# Patient Record
Sex: Male | Born: 1970
Health system: Southern US, Community
[De-identification: ages and names within clinical notes are randomized; demographics above are authoritative.]

## PROBLEM LIST (undated history)

## (undated) DIAGNOSIS — L109 Pemphigus, unspecified: Secondary | ICD-10-CM

## (undated) DIAGNOSIS — J189 Pneumonia, unspecified organism: Secondary | ICD-10-CM

## (undated) HISTORY — DX: Pemphigus, unspecified: L10.9

---

## 2008-09-13 ENCOUNTER — Observation Stay (HOSPITAL_COMMUNITY): Admission: EM | Admit: 2008-09-13 | Discharge: 2008-09-14 | Payer: Self-pay | Admitting: Emergency Medicine

## 2011-07-14 ENCOUNTER — Emergency Department (HOSPITAL_BASED_OUTPATIENT_CLINIC_OR_DEPARTMENT_OTHER)
Admission: EM | Admit: 2011-07-14 | Discharge: 2011-07-14 | Disposition: A | Discharge status: Home Health | Payer: Worker's Compensation | Attending: Emergency Medicine | Admitting: Emergency Medicine

## 2011-07-14 ENCOUNTER — Encounter (HOSPITAL_BASED_OUTPATIENT_CLINIC_OR_DEPARTMENT_OTHER): Payer: Self-pay

## 2011-07-14 DIAGNOSIS — S0101XA Laceration without foreign body of scalp, initial encounter: Secondary | ICD-10-CM

## 2011-07-14 DIAGNOSIS — W268XXA Contact with other sharp object(s), not elsewhere classified, initial encounter: Secondary | ICD-10-CM | POA: Insufficient documentation

## 2011-07-14 DIAGNOSIS — S0180XA Unspecified open wound of other part of head, initial encounter: Secondary | ICD-10-CM | POA: Insufficient documentation

## 2011-07-14 DIAGNOSIS — F17200 Nicotine dependence, unspecified, uncomplicated: Secondary | ICD-10-CM | POA: Insufficient documentation

## 2011-07-14 NOTE — ED Notes (Signed)
Pt c/o laceration on R head.  Bleeding controlled upon arrival.  Pt unaware of Tetanus status.  No LOC.

## 2011-07-14 NOTE — ED Provider Notes (Signed)
Medical screening examination/treatment/procedure(s) were performed by non-physician practitioner and as supervising physician I was immediately available for consultation/collaboration.   Carleene Cooper III, MD 07/14/11 [phone]

## 2011-07-14 NOTE — ED Provider Notes (Signed)
History     CSN: [id number]  Arrival date & time 07/14/11  1159   First MD Initiated Contact with Patient 07/14/11 1241      Chief Complaint  Patient presents with  . Laceration    (Consider location/radiation/quality/duration/timing/severity/associated sxs/prior treatment) Patient is a 41 y.o. male presenting with skin laceration. The history is provided by the patient.  Laceration  The incident occurred less than 1 hour ago. The laceration is located on the scalp. The laceration is 1 cm in size. The laceration mechanism is unknown.The pain is at a severity of 2/10. The pain is mild. Possible foreign bodies include metal.  Pt   History reviewed. No pertinent past medical history.  History reviewed. No pertinent past surgical history.  No family history on file.  History  Substance Use Topics  . Smoking status: Current Everyday Smoker  . Smokeless tobacco: Not on file  . Alcohol Use: No      Review of Systems  Skin: Positive for wound.  All other systems reviewed and are negative.    Allergies  Review of patient's allergies indicates no known allergies.  Home Medications  No current outpatient prescriptions on file.  BP 118/72  Pulse 70  Temp 98.2 F (36.8 C) (Oral)  Resp 16  Ht 5\' 3"  (1.6 m)  Wt 120 lb (54.432 kg)  BMI 21.26 kg/m2  SpO2 99%  Physical Exam  Nursing note and vitals reviewed. Constitutional: He is oriented to person, place, and time. He appears well-developed and well-nourished.  HENT:  Head: Normocephalic.       1cm laceration forehead  Eyes: Conjunctivae and EOM are normal. Pupils are equal, round, and reactive to light.  Neck: Normal range of motion.  Cardiovascular: Normal rate.   Pulmonary/Chest: Effort normal.  Musculoskeletal: Normal range of motion.  Neurological: He is alert and oriented to person, place, and time. He has normal reflexes.  Skin: Skin is warm and dry.  Psychiatric: He has a normal mood and affect.    ED  Course  LACERATION REPAIR Date/Time: 07/14/2011 1:47 PM Performed by: Elson Areas Authorized by: Elson Areas Consent: Verbal consent not obtained. Consent given by: patient Body area: head/neck Laceration length: 1 cm Skin closure: staples Number of sutures: 1 Approximation difficulty: simple   (including critical care time)  Labs Reviewed - No data to display No results found.   1. Laceration of scalp       MDM  Staple removal in 7 days        Williams Dietrick [place], [place] 07/14/11 1347

## 2011-07-14 NOTE — ED Notes (Signed)
UDS required per Dorothey Baseman, Plant Mgr.

## 2011-07-14 NOTE — Discharge Instructions (Signed)
Laceration Care, Adult °A laceration is a cut that goes through all layers of the skin. The cut goes into the tissue beneath the skin. °HOME CARE °For stitches (sutures) or staples: °· Keep the cut clean and dry.  °· If you have a bandage (dressing), change it at least once a day. Change the bandage if it gets wet or dirty, or as told by your doctor.  °· Wash the cut with soap and water 2 times a day. Rinse the cut with water. Pat it dry with a clean towel.  °· Put a thin layer of medicated cream on the cut as told by your doctor.  °· You may shower after the first 24 hours. Do not soak the cut in water until the stitches are removed.  °· Only take medicines as told by your doctor.  °· Have your stitches or staples removed as told by your doctor.  °For skin adhesive strips: °· Keep the cut clean and dry.  °· Do not get the strips wet. You may take a bath, but be careful to keep the cut dry.  °· If the cut gets wet, pat it dry with a clean towel.  °· The strips will fall off on their own. Do not remove the strips that are still stuck to the cut.  °For wound glue: °· You may shower or take baths. Do not soak or scrub the cut. Do not swim. Avoid heavy sweating until the glue falls off on its own. After a shower or bath, pat the cut dry with a clean towel.  °· Do not put medicine on your cut until the glue falls off.  °· If you have a bandage, do not put tape over the glue.  °· Avoid lots of sunlight or tanning lamps until the glue falls off. Put sunscreen on the cut for the first year to reduce your scar.  °· The glue will fall off on its own. Do not pick at the glue.  °You may need a tetanus shot if: °· You cannot remember when you had your last tetanus shot.  °· You have never had a tetanus shot.  °If you need a tetanus shot and you choose not to have one, you may get tetanus. Sickness from tetanus can be serious. °GET HELP RIGHT AWAY IF:  °· Your pain does not get better with medicine.  °· Your arm, hand, leg, or  foot loses feeling (numbness) or changes color.  °· Your cut is bleeding.  °· Your joint feels weak, or you cannot use your joint.  °· You have painful lumps on your body.  °· Your cut is red, puffy (swollen), or painful.  °· You have a red line on the skin near the cut.  °· You have yellowish-white fluid (pus) coming from the cut.  °· You have a fever.  °· You have a bad smell coming from the cut or bandage.  °· Your cut breaks open before or after stitches are removed.  °· You notice something coming out of the cut, such as wood or glass.  °· You cannot move a finger or toe.  °MAKE SURE YOU:  °· Understand these instructions.  °· Will watch your condition.  °· Will get help right away if you are not doing well or get worse.  °Document Released: 06/24/2007 Document Revised: 12/25/2010 Document Reviewed: 07/01/2010 °ExitCare® Patient Information ©2012 [employer]. °

## 2013-08-16 ENCOUNTER — Other Ambulatory Visit: Payer: Self-pay | Admitting: Family Medicine

## 2013-08-16 DIAGNOSIS — R200 Anesthesia of skin: Secondary | ICD-10-CM

## 2013-08-22 ENCOUNTER — Other Ambulatory Visit: Payer: Self-pay | Admitting: Family Medicine

## 2013-08-22 DIAGNOSIS — Z139 Encounter for screening, unspecified: Secondary | ICD-10-CM

## 2013-08-24 ENCOUNTER — Ambulatory Visit
Admission: RE | Admit: 2013-08-24 | Discharge: 2013-08-24 | Disposition: A | Discharge status: Home / Self Care | Payer: PRIVATE HEALTH INSURANCE | Source: Ambulatory Visit | Attending: Family Medicine | Admitting: Family Medicine

## 2013-08-24 DIAGNOSIS — Z139 Encounter for screening, unspecified: Secondary | ICD-10-CM

## 2013-08-24 DIAGNOSIS — R200 Anesthesia of skin: Secondary | ICD-10-CM

## 2017-10-23 ENCOUNTER — Other Ambulatory Visit: Payer: Self-pay

## 2017-10-23 ENCOUNTER — Ambulatory Visit: Payer: BLUE CROSS/BLUE SHIELD | Admitting: Family Medicine

## 2017-10-23 ENCOUNTER — Encounter: Payer: Self-pay | Admitting: Family Medicine

## 2017-10-23 VITALS — BP 125/75 | HR 64 | Temp 98.0°F | Resp 16 | Wt 142.0 lb

## 2017-10-23 DIAGNOSIS — S1012XA Blister (nonthermal) of throat, initial encounter: Secondary | ICD-10-CM

## 2017-10-23 DIAGNOSIS — J029 Acute pharyngitis, unspecified: Secondary | ICD-10-CM | POA: Diagnosis not present

## 2017-10-23 LAB — POCT RAPID STREP A (OFFICE): Rapid Strep A Screen: NEGATIVE

## 2017-10-23 MED ORDER — VALACYCLOVIR HCL 1 G PO TABS: 1000.0000 mg | ORAL_TABLET | Freq: Two times a day (BID) | ORAL | 0 refills | Status: DC

## 2017-10-23 MED ORDER — LIDOCAINE VISCOUS HCL 2 % MT SOLN: 15.0000 mL | OROMUCOSAL | 0 refills | Status: DC | PRN

## 2017-10-23 NOTE — Patient Instructions (Signed)
° ° ° °  If you have lab work done today you will be contacted with your lab results within the next 2 weeks.  If you have not heard from us then please contact us. The fastest way to get your results is to register for My Chart. ° ° °IF you received an x-ray today, you will receive an invoice from [facility]. Please contact [facility] at [phone] with questions or concerns regarding your invoice.  ° °IF you received labwork today, you will receive an invoice from [facility]. Please contact [facility] at [phone] with questions or concerns regarding your invoice.  ° °Our billing staff will not be able to assist you with questions regarding bills from these companies. ° °You will be contacted with the lab results as soon as they are available. The fastest way to get your results is to activate your My Chart account. Instructions are located on the last page of this paperwork. If you have not heard from us regarding the results in 2 weeks, please contact this office. °  ° ° ° °

## 2017-10-23 NOTE — Progress Notes (Signed)
Patient ID: Kyle Guzman, male    DOB: [birth year], 47 y.o.   MRN: [id number]  PCP: Patient, No Pcp Per  Chief Complaint  Patient presents with  . Sore Throat    x 2 days     Subjective:  HPI Kyle Guzman is a 47 y.o. male presents for evaluation of sore throat x 1 days. He reports pain with swallowing solid foods.  Denies cough, nasal drainage, fever, or dysphasia. History of smoking, however quit several years ago.  Prior diagnosis of strep throat and no history of pharyngitis.Has not taken anything for pain. Social History   Socioeconomic History  . Marital status: Unknown    Spouse name: Not on file  . Number of children: Not on file  . Years of education: Not on file  . Highest education level: Not on file  Occupational History  . Not on file  Social Needs  . Financial resource strain: Not on file  . Food insecurity:    Worry: Not on file    Inability: Not on file  . Transportation needs:    Medical: Not on file    Non-medical: Not on file  Tobacco Use  . Smoking status: Current Every Day Smoker  . Smokeless tobacco: Never Used  Substance and Sexual Activity  . Alcohol use: No  . Drug use: No  . Sexual activity: Not on file  Lifestyle  . Physical activity:    Days per week: Not on file    Minutes per session: Not on file  . Stress: Not on file  Relationships  . Social connections:    Talks on phone: Not on file    Gets together: Not on file    Attends religious service: Not on file    Active member of club or organization: Not on file    Attends meetings of clubs or organizations: Not on file    Relationship status: Not on file  . Intimate partner violence:    Fear of current or ex partner: Not on file    Emotionally abused: Not on file    Physically abused: Not on file    Forced sexual activity: Not on file  Other Topics Concern  . Not on file  Social History Narrative  . Not on file    History reviewed. No pertinent family history.   Review of  Systems Pertinent negatives listed in HPI  There are no active problems to display for this patient.   No Known Allergies  Prior to Admission medications   Not on File    Past Medical, Surgical Family and Social History reviewed and updated.    Objective:   Today's Vitals   10/23/17 1203  BP: 125/75  Pulse: 64  Resp: 16  Temp: 98 F (36.7 C)  TempSrc: Oral  SpO2: 99%  Weight: 142 lb (64.4 kg)    Wt Readings from Last 3 Encounters:  10/23/17 142 lb (64.4 kg)  07/14/11 120 lb (54.4 kg)       Physical Exam General appearance: alert, well developed, well nourished, cooperative and in no distress HENT: Normocephalic. Left upper tonsillar and oropharyngeal wall significant fine clear blisters with surrounding erythema and mild edema noted . Respiratory: Respirations even and unlabored, normal respiratory rate Heart: rate and rhythm normal. No gallop or murmurs noted on exam  Extremities: No gross deformities Skin: Skin color, texture, turgor normal. No rashes seen  Psych: Appropriate mood and affect. Neurologic: Mental status: Alert, oriented to person, place, and  time, thought content appropriate.   Assessment & Plan:  1. Sore throat, on exam left tonsillar and oropharyngeal sniffing cannot for your erythema as well as fine blister like lesions.  Pharyngeal include HSV or shingles.  This likely streptococcal infection however strep culture.  Also obtain a viral culture to rule out HSV.  Will trial a course of Valtrex x10 days decrease duration of virus as well as provide lidocaine rinse to minimize pain.  To follow-up 10/26/2017 further evaluation of throat and to see if symptoms are improving.  No improvement patient may warrant referral to ENT for second opinion.  - POCT rapid strep A - Culture, Group A Strep  Meds ordered this encounter  Medications  . lidocaine (XYLOCAINE) 2 % solution    Sig: Use as directed 15 mLs in the mouth or throat as needed for mouth  pain.    Dispense:  100 mL    Refill:  0  . valACYclovir (VALTREX) 1000 MG tablet    Sig: Take 1 tablet (1,000 mg total) by mouth 2 (two) times daily for 10 days.    Dispense:  20 tablet    Refill:  0      -The patient was given clear instructions to go to ER or return to medical center if symptoms do not improve, worsen or new problems develop. The patient verbalized understanding.     Godfrey Pick. Tiburcio Pea, FNP-C Nurse Practitioner (PRN Staff)  Primary Care at [facility] [address] [place], [place]  [phone]

## 2017-10-25 LAB — HERPES SIMPLEX VIRUS CULTURE

## 2017-10-26 ENCOUNTER — Encounter: Payer: Self-pay | Admitting: Family Medicine

## 2017-10-26 ENCOUNTER — Other Ambulatory Visit: Payer: Self-pay

## 2017-10-26 ENCOUNTER — Ambulatory Visit: Payer: BLUE CROSS/BLUE SHIELD | Admitting: Family Medicine

## 2017-10-26 VITALS — BP 116/75 | HR 75 | Temp 98.3°F | Resp 16 | Ht 63.0 in | Wt 143.8 lb

## 2017-10-26 DIAGNOSIS — S1012XD Blister (nonthermal) of throat, subsequent encounter: Secondary | ICD-10-CM | POA: Diagnosis not present

## 2017-10-26 DIAGNOSIS — J392 Other diseases of pharynx: Secondary | ICD-10-CM

## 2017-10-26 DIAGNOSIS — S1012XA Blister (nonthermal) of throat, initial encounter: Secondary | ICD-10-CM

## 2017-10-26 LAB — CULTURE, GROUP A STREP

## 2017-10-26 NOTE — Progress Notes (Signed)
Chief Complaint  Patient presents with  . Sore Throat    onset: 10/22/17*with blisters, seen and given medication for it used med friday, sat, sun and monday no med today.  Blister back of throat    HPI  Pt with oral lesion in his mouth that is like a blister He states that given lidocaine solution and valtrex  He reports that he had a day of chills the day before the oral lesion started Onset 10/22/2017 He was seen on 10/23/17 and had cultures taken of the throat He states that the lidocaine solution and valtrex are not helping He denies pain with swallowing He reports that the throat feels normal It looks bloody to him like it is spreading from one side to the next He is a nonsmoker   No past medical history on file.  Current Outpatient Medications  Medication Sig Dispense Refill  . lidocaine (XYLOCAINE) 2 % solution Use as directed 15 mLs in the mouth or throat as needed for mouth pain. 100 mL 0  . valACYclovir (VALTREX) 1000 MG tablet Take 1 tablet (1,000 mg total) by mouth 2 (two) times daily for 10 days. 20 tablet 0   No current facility-administered medications for this visit.     Allergies: No Known Allergies  No past surgical history on file.  Social History   Socioeconomic History  . Marital status: Unknown    Spouse name: Not on file  . Number of children: Not on file  . Years of education: Not on file  . Highest education level: Not on file  Occupational History  . Not on file  Social Needs  . Financial resource strain: Not on file  . Food insecurity:    Worry: Not on file    Inability: Not on file  . Transportation needs:    Medical: Not on file    Non-medical: Not on file  Tobacco Use  . Smoking status: Former Games developer  . Smokeless tobacco: Former Neurosurgeon    Quit date: 10/27/2010  Substance and Sexual Activity  . Alcohol use: No  . Drug use: No  . Sexual activity: Not on file  Lifestyle  . Physical activity:    Days per week: Not on file   Minutes per session: Not on file  . Stress: Not on file  Relationships  . Social connections:    Talks on phone: Not on file    Gets together: Not on file    Attends religious service: Not on file    Active member of club or organization: Not on file    Attends meetings of clubs or organizations: Not on file    Relationship status: Not on file  Other Topics Concern  . Not on file  Social History Narrative  . Not on file    No family history on file.   ROS Review of Systems See HPI Constitution: No fevers or chills No malaise No diaphoresis Skin: No rash or itching Eyes: no blurry vision, no double vision GU: no dysuria or hematuria Neuro: no dizziness or headaches all others reviewed and negative   Objective: Vitals:   10/26/17 1450  BP: 116/75  Pulse: 75  Resp: 16  Temp: 98.3 F (36.8 C)  TempSrc: Oral  SpO2: 97%  Weight: 143 lb 12.8 oz (65.2 kg)  Height: 5\' 3"  (1.6 m)    Physical Exam  Constitutional: He appears well-developed and well-nourished.  HENT:  Head: Normocephalic and atraumatic.  Pulmonary/Chest: Effort normal.   Throat  exam Soft palate with hemorrhagic and vesicular lesion Palate raises appropriately Normal exam of the tongue Trachea midline Mucosa moist Non-tender lymph nodes the the neck   Today's image 10/26/2017- area of erythema and hemorrhage cross ing the midline   Image on 10/23/2017 - area of erythema concentrated to the left posterior pharynx    HSV culture  Negative GAS culture  Negative Rapid strep negative  Assessment and Plan [place] was seen today for sore throat.  Diagnoses and all orders for this visit:  Blister of throat -     Ambulatory referral to ENT  Lesion of throat -     Ambulatory referral to ENT  will refer Urgently to ENT Advised him to stop the valtrex since the culture is negative  Since there is no pain he can stop the lidocaine   Lasean Rahming A Creta Levin

## 2017-10-26 NOTE — Patient Instructions (Addendum)
   If you have lab work done today you will be contacted with your lab results within the next 2 weeks.  If you have not heard from us then please contact us. The fastest way to get your results is to register for My Chart.   IF you received an x-ray today, you will receive an invoice from [facility]. Please contact [facility] at [phone] with questions or concerns regarding your invoice.   IF you received labwork today, you will receive an invoice from [facility]. Please contact [facility] at [phone] with questions or concerns regarding your invoice.   Our billing staff will not be able to assist you with questions regarding bills from these companies.  You will be contacted with the lab results as soon as they are available. The fastest way to get your results is to activate your My Chart account. Instructions are located on the last page of this paperwork. If you have not heard from us regarding the results in 2 weeks, please contact this office.     Pharyngitis Pharyngitis is redness, pain, and swelling (inflammation) of the throat (pharynx). It is a very common cause of sore throat. Pharyngitis can be caused by a bacteria, but it is usually caused by a virus. Most cases of pharyngitis get better on their own without treatment. What are the causes? This condition may be caused by:  Infection by viruses (viral). Viral pharyngitis spreads from person to person (is contagious) through coughing, sneezing, and sharing of personal items or utensils such as cups, forks, spoons, and toothbrushes.  Infection by bacteria (bacterial). Bacterial pharyngitis may be spread by touching the nose or face after coming in contact with the bacteria, or through more intimate contact, such as kissing.  Allergies. Allergies can cause buildup of mucus in the throat (post-nasal drip), leading to inflammation and irritation. Allergies can also cause blocked nasal passages, forcing  breathing through the mouth, which dries and irritates the throat.  What increases the risk? You are more likely to develop this condition if:  You are 5-24 years old.  You are exposed to crowded environments such as daycare, school, or dormitory living.  You live in a cold climate.  You have a weakened disease-fighting (immune) system.  What are the signs or symptoms? Symptoms of this condition vary by the cause (viral, bacterial, or allergies) and can include:  Sore throat.  Fatigue.  Low-grade fever.  Headache.  Joint pain and muscle aches.  Skin rashes.  Swollen glands in the throat (lymph nodes).  Plaque-like film on the throat or tonsils. This is often a symptom of bacterial pharyngitis.  Vomiting.  Stuffy nose (nasal congestion).  Cough.  Red, itchy eyes (conjunctivitis).  Loss of appetite.  How is this diagnosed? This condition is often diagnosed based on your medical history and a physical exam. Your health care provider will ask you questions about your illness and your symptoms. A swab of your throat may be done to check for bacteria (rapid strep test). Other lab tests may also be done, depending on the suspected cause, but these are rare. How is this treated? This condition usually gets better in 3-4 days without medicine. Bacterial pharyngitis may be treated with antibiotic medicines. Follow these instructions at home:  Take over-the-counter and prescription medicines only as told by your health care provider. ? If you were prescribed an antibiotic medicine, take it as told by your health care provider. Do not stop taking the antibiotic even if   you start to feel better. ? Do not give children aspirin because of the association with Reye syndrome.  Drink enough water and fluids to keep your urine clear or pale yellow.  Get a lot of rest.  Gargle with a salt-water mixture 3-4 times a day or as needed. To make a salt-water mixture, completely dissolve  -1 tsp of salt in 1 cup of warm water.  If your health care provider approves, you may use throat lozenges or sprays to soothe your throat. Contact a health care provider if:  You have large, tender lumps in your neck.  You have a rash.  You cough up green, yellow-brown, or bloody spit. Get help right away if:  Your neck becomes stiff.  You drool or are unable to swallow liquids.  You cannot drink or take medicines without vomiting.  You have severe pain that does not go away, even after you take medicine.  You have trouble breathing, and it is not caused by a stuffy nose.  You have new pain and swelling in your joints such as the knees, ankles, wrists, or elbows. Summary  Pharyngitis is redness, pain, and swelling (inflammation) of the throat (pharynx).  While pharyngitis can be caused by a bacteria, the most common causes are viral.  Most cases of pharyngitis get better on their own without treatment.  Bacterial pharyngitis is treated with antibiotic medicines. This information is not intended to replace advice given to you by your health care provider. Make sure you discuss any questions you have with your health care provider. Document Released: 01/05/2005 Document Revised: 02/11/2016 Document Reviewed: 02/11/2016 Elsevier Interactive Patient Education  2018 Elsevier Inc.  

## 2017-10-27 MED ORDER — AMOXICILLIN 500 MG PO CAPS: 500.0000 mg | ORAL_CAPSULE | Freq: Two times a day (BID) | ORAL | 0 refills | Status: AC

## 2017-10-27 NOTE — Addendum Note (Signed)
Addended by: Bing Neighbors on: 10/27/2017 10:06 AM   Modules accepted: Orders

## 2019-04-24 ENCOUNTER — Ambulatory Visit: Payer: PRIVATE HEALTH INSURANCE | Attending: Internal Medicine

## 2019-04-24 DIAGNOSIS — Z23 Encounter for immunization: Secondary | ICD-10-CM

## 2019-04-24 NOTE — Progress Notes (Addendum)
   Covid-19 Vaccination Clinic  Name:  Samari Gorby    MRN: [id number] DOB: 07/16/1970  04/24/2019  Mr. Fiumara was observed post Covid-19 immunization for 30 minutes without incident. He was provided with Vaccine Information Sheet and instruction to access the V-Safe system.   Mr. Armistead was instructed to call 911 with any severe reactions post vaccine: Marland Kitchen Difficulty breathing  . Swelling of face and throat  . A fast heartbeat  . A bad rash all over body  . Dizziness and weakness   Immunizations Administered    Name Date Dose VIS Date Route   Pfizer COVID-19 Vaccine 04/24/2019 12:37 PM 0.3 mL 12/30/2018 Intramuscular   Manufacturer: [employer], [employer]   Lot: [id number]   NDC: [address]-3

## 2019-05-15 ENCOUNTER — Ambulatory Visit: Payer: PRIVATE HEALTH INSURANCE | Attending: Internal Medicine

## 2019-05-15 DIAGNOSIS — Z23 Encounter for immunization: Secondary | ICD-10-CM

## 2019-05-15 NOTE — Progress Notes (Signed)
   Covid-19 Vaccination Clinic  Name:  Virlan Kempker    MRN: [id number] DOB: June 30, 1970  05/15/2019  Mr. Christiano was observed post Covid-19 immunization for 30 minutes based on pre-vaccination screening without incident. He was provided with Vaccine Information Sheet and instruction to access the V-Safe system.   Mr. Bentler was instructed to call 911 with any severe reactions post vaccine: Marland Kitchen Difficulty breathing  . Swelling of face and throat  . A fast heartbeat  . A bad rash all over body  . Dizziness and weakness   Immunizations Administered    Name Date Dose VIS Date Route   Pfizer COVID-19 Vaccine 05/15/2019  2:56 PM 0.3 mL 03/15/2018 Intramuscular   Manufacturer: [employer], [employer]   Lot: [id number]   NDC: [address]-1

## 2019-06-19 ENCOUNTER — Encounter: Payer: Self-pay | Admitting: *Deleted

## 2019-06-19 ENCOUNTER — Ambulatory Visit
Admission: EM | Admit: 2019-06-19 | Discharge: 2019-06-19 | Disposition: A | Discharge status: Home / Self Care | Payer: BC Managed Care – PPO | Attending: Family Medicine | Admitting: Family Medicine

## 2019-06-19 ENCOUNTER — Other Ambulatory Visit: Payer: Self-pay

## 2019-06-19 DIAGNOSIS — L249 Irritant contact dermatitis, unspecified cause: Secondary | ICD-10-CM

## 2019-06-19 MED ORDER — PREDNISONE 20 MG PO TABS: 20.0000 mg | ORAL_TABLET | Freq: Every day | ORAL | 0 refills | Status: DC

## 2019-06-19 MED ORDER — TRIAMCINOLONE ACETONIDE 0.1 % EX CREA: 454.0000 "application " | TOPICAL_CREAM | Freq: Two times a day (BID) | CUTANEOUS | 0 refills | Status: DC

## 2019-06-19 MED ORDER — METHYLPREDNISOLONE SODIUM SUCC 125 MG IJ SOLR
62.5000 mg | Freq: Once | INTRAMUSCULAR | Status: AC
  Administered 2019-06-19: 62.5 mg via INTRAMUSCULAR

## 2019-06-19 NOTE — ED Provider Notes (Signed)
[facility]    CSN: [id number] Arrival date & time: 06/19/19  1023      History   Chief Complaint Chief Complaint  Patient presents with  . Rash    HPI Kyle Guzman is a 49 y.o. male.   HPI  Patient complains of a rash. Symptoms began 2 days ago. Patient describes the rash as erythematous, macular, and itching. He endorses recent contact with poison ivy. Medications currently using: Calamine lotion environmental exposures or allergies: None known. Patient denies having any recent viral illness or fever.   History reviewed. No pertinent past medical history.  There are no problems to display for this patient.  History reviewed. No pertinent surgical history.     Home Medications    Prior to Admission medications   Medication Sig Start Date End Date Taking? Authorizing Provider  lidocaine (XYLOCAINE) 2 % solution Use as directed 15 mLs in the mouth or throat as needed for mouth pain. 10/23/17   Scot Jun, FNP    Family History Family History  Problem Relation Age of Onset  . Healthy Mother     Social History Social History   Tobacco Use  . Smoking status: Former Research scientist (life sciences)  . Smokeless tobacco: Former Systems developer    Quit date: 10/27/2010  Substance Use Topics  . Alcohol use: No  . Drug use: No     Allergies   Patient has no known allergies. Review of Systems Review of Systems Pertinent negatives listed in HPI Physical Exam Triage Vital Signs ED Triage Vitals  Enc Vitals Group     BP 06/19/19 1034 115/72     Pulse Rate 06/19/19 1034 65     Resp 06/19/19 1034 16     Temp 06/19/19 1034 98.2 F (36.8 C)     Temp Source 06/19/19 1034 Oral     SpO2 06/19/19 1034 98 %     Weight --      Height --      Head Circumference --      Peak Flow --      Pain Score 06/19/19 1040 0     Pain Loc --      Pain Edu? --      Excl. in [place]? --    No data found.  Updated Vital Signs BP 115/72 (BP Location: Left Arm)   Pulse 65   Temp 98.2 F (36.8 C)  (Oral)   Resp 16   SpO2 98%   Visual Acuity Right Eye Distance:   Left Eye Distance:   Bilateral Distance:    Right Eye Near:   Left Eye Near:    Bilateral Near:     Physical Exam Cardiovascular:     Rate and Rhythm: Normal rate.  Pulmonary:     Effort: Pulmonary effort is normal.     Breath sounds: Normal breath sounds and air entry.  Skin:    General: Skin is warm.     Findings: Rash present. Rash is urticarial.     Comments: Generalized macular rash , erythematous, uticarial rash, distribution consistent with dermatitis  Psychiatric:        Attention and Perception: Attention normal.        Mood and Affect: Mood normal.        Speech: Speech normal.      UC Treatments / Results  Labs (all labs ordered are listed, but only abnormal results are displayed) Labs Reviewed - No data to display  EKG   Radiology No  results found.  Procedures Procedures (including critical care time)  Medications Ordered in UC Medications - No data to display  Initial Impression / Assessment and Plan / UC Course  I have reviewed the triage vital signs and the nursing notes.  Pertinent labs & imaging results that were available during my care of the patient were reviewed by me and considered in my medical decision making (see chart for details).      Irritant Dermatitis, possibly related to poision ivy. Will treat as follows: Solumedrol 62.5 mg IM once Triamcinolone cream TID to affected area Prednisone taper x 9 days, start tomorrow. Red Flags discussed. An After Visit Summary was printed and given to the patient. Follow-up with PCP if symptoms worsen. Final Clinical Impressions(s) / UC Diagnoses   Final diagnoses:  Irritant contact dermatitis, unspecified trigger   Discharge Instructions   None    ED Prescriptions    Medication Sig Dispense Auth. Provider   triamcinolone cream (KENALOG) 0.1 % Apply 454 application topically 2 (two) times daily. 454 g Bing Neighbors, FNP   predniSONE (DELTASONE) 20 MG tablet Take 1 tablet (20 mg total) by mouth daily with breakfast. Prednisone 20 mg, in mornings with breakfast as follows: Take 3 pills for 3 days, take 2 pills for 3 days, and take 1 pill for 3 days. 18 tablet Bing Neighbors, FNP     PDMP not reviewed this encounter.   Bing Neighbors, FNP 06/23/19 [phone]

## 2019-06-19 NOTE — ED Triage Notes (Signed)
Reports working in yard with exposure to poison ivy; states broke out in rash to multiple body areas 3 days ago; rash with blistering worse on BUE.  Has been applying calamine lotion.

## 2019-12-27 DIAGNOSIS — J039 Acute tonsillitis, unspecified: Secondary | ICD-10-CM | POA: Insufficient documentation

## 2019-12-27 DIAGNOSIS — R070 Pain in throat: Secondary | ICD-10-CM | POA: Insufficient documentation

## 2020-04-07 ENCOUNTER — Encounter: Payer: Self-pay | Admitting: *Deleted

## 2020-04-07 ENCOUNTER — Other Ambulatory Visit: Payer: Self-pay

## 2020-04-07 ENCOUNTER — Ambulatory Visit
Admission: EM | Admit: 2020-04-07 | Discharge: 2020-04-07 | Disposition: A | Discharge status: Home / Self Care | Payer: BC Managed Care – PPO | Attending: Emergency Medicine | Admitting: Emergency Medicine

## 2020-04-07 DIAGNOSIS — K1379 Other lesions of oral mucosa: Secondary | ICD-10-CM | POA: Diagnosis not present

## 2020-04-07 DIAGNOSIS — R21 Rash and other nonspecific skin eruption: Secondary | ICD-10-CM

## 2020-04-07 MED ORDER — MUPIROCIN 2 % EX OINT: 1.0000 "application " | TOPICAL_OINTMENT | Freq: Two times a day (BID) | CUTANEOUS | 0 refills | Status: AC

## 2020-04-07 MED ORDER — DOXYCYCLINE HYCLATE 100 MG PO CAPS: 100.0000 mg | ORAL_CAPSULE | Freq: Two times a day (BID) | ORAL | 0 refills | Status: AC

## 2020-04-07 MED ORDER — VALACYCLOVIR HCL 1 G PO TABS: 1000.0000 mg | ORAL_TABLET | Freq: Three times a day (TID) | ORAL | 0 refills | Status: AC

## 2020-04-07 MED ORDER — NYSTATIN 100000 UNIT/ML MT SUSP: 5.0000 mL | Freq: Three times a day (TID) | OROMUCOSAL | 0 refills | Status: AC | PRN

## 2020-04-07 NOTE — ED Triage Notes (Signed)
C/O painful facial and oral lesions x approx 1 month.  States has been referred to dermatologist, but has not received appt yet.  States lesions on nose bleed when washing face.  Also reports having few sporadic pruritic tiny vesicles to various parts of body over past few days.  Denies fevers.

## 2020-04-07 NOTE — Discharge Instructions (Signed)
Please begin doxycycline twice daily for 10 days to help with possible skin infection Bactroban ointment twice daily to areas on face Begin Valtrex 3 times daily for 10 days to help with oral sores Use Magic mouthwash 3 times daily as needed for pain  Please follow-up if not improving or worsening

## 2020-04-07 NOTE — ED Provider Notes (Signed)
[facility]    CSN: [id number] Arrival date & time: 04/07/20  1025      History   Chief Complaint Chief Complaint  Patient presents with  . Facial and Oral Lesions  . Rash    HPI Kyle Guzman is a 49 y.o. male presenting today for evaluation of a rash.  Reports rash to mouth and nose.  Reports over the past 1 to 2 months he has developed sores to his nose which initially started off as vesicular in nature and then have crusted and been weeping.  Has persisted for a few months.  Also reports associated oral sores for similar timeframe.  Denies any fevers.  Reports pain with swallowing.  Reports he has been seeing his primary care for this and had blood work done which was normal, unable to see blood work in chart.  Has plans to follow-up with dermatology.  HPI  History reviewed. No pertinent past medical history.  There are no problems to display for this patient.   History reviewed. No pertinent surgical history.     Home Medications    Prior to Admission medications   Medication Sig Start Date End Date Taking? Authorizing Provider  doxycycline (VIBRAMYCIN) 100 MG capsule Take 1 capsule (100 mg total) by mouth 2 (two) times daily for 10 days. 04/07/20 04/17/20 Yes Talaya Lamprecht C, PA-C  magic mouthwash (nystatin, lidocaine, diphenhydrAMINE, alum & mag hydroxide) suspension Swish and swallow 5 mLs 3 (three) times daily as needed for mouth pain. 04/07/20  Yes Tanav Orsak C, PA-C  mupirocin ointment (BACTROBAN) 2 % Apply 1 application topically 2 (two) times daily. To skin sores 04/07/20  Yes Darrious Youman C, PA-C  valACYclovir (VALTREX) 1000 MG tablet Take 1 tablet (1,000 mg total) by mouth 3 (three) times daily for 10 days. 04/07/20 04/17/20 Yes Genell Thede, Junius Creamer, PA-C    Family History Family History  Problem Relation Age of Onset  . Healthy Mother     Social History Social History   Tobacco Use  . Smoking status: Former Games developer  . Smokeless tobacco:  Former Neurosurgeon    Quit date: 10/27/2010  Vaping Use  . Vaping Use: Never used  Substance Use Topics  . Alcohol use: No  . Drug use: No     Allergies   Patient has no known allergies.   Review of Systems Review of Systems  Constitutional: Negative for fatigue and fever.  HENT: Positive for mouth sores.   Eyes: Negative for redness, itching and visual disturbance.  Respiratory: Negative for shortness of breath.   Cardiovascular: Negative for chest pain and leg swelling.  Gastrointestinal: Negative for nausea and vomiting.  Musculoskeletal: Negative for arthralgias and myalgias.  Skin: Positive for color change and rash. Negative for wound.  Neurological: Negative for dizziness, syncope, weakness, light-headedness and headaches.     Physical Exam Triage Vital Signs ED Triage Vitals  Enc Vitals Group     BP      Pulse      Resp      Temp      Temp src      SpO2      Weight      Height      Head Circumference      Peak Flow      Pain Score      Pain Loc      Pain Edu?      Excl. in [place]?    No data found.  Updated Vital  Signs BP 121/79   Pulse 82   Temp 97.9 F (36.6 C) (Oral)   Resp 16   SpO2 97%   Visual Acuity Right Eye Distance:   Left Eye Distance:   Bilateral Distance:    Right Eye Near:   Left Eye Near:    Bilateral Near:     Physical Exam Vitals and nursing note reviewed.  Constitutional:      Appearance: He is well-developed.     Comments: No acute distress  HENT:     Head: Normocephalic and atraumatic.     Nose: Nose normal.     Mouth/Throat:     Comments: Various erythematous healing ulcers noted within oromucosa to lip, roof of mouth with erythema Eyes:     Conjunctiva/sclera: Conjunctivae normal.  Cardiovascular:     Rate and Rhythm: Normal rate.  Pulmonary:     Effort: Pulmonary effort is normal. No respiratory distress.  Abdominal:     General: There is no distension.  Musculoskeletal:        General: Normal range of motion.      Cervical back: Neck supple.  Skin:    General: Skin is warm and dry.     Comments: Nose and face with erythema yellow crusting and open sores  Right forearm with isolated fluid-filled vesicle on erythematous base  Anterior lower legs with erythematous lesions  Neurological:     Mental Status: He is alert and oriented to person, place, and time.              UC Treatments / Results  Labs (all labs ordered are listed, but only abnormal results are displayed) Labs Reviewed - No data to display  EKG   Radiology No results found.  Procedures Procedures (including critical care time)  Medications Ordered in UC Medications - No data to display  Initial Impression / Assessment and Plan / UC Course  I have reviewed the triage vital signs and the nursing notes.  Pertinent labs & imaging results that were available during my care of the patient were reviewed by me and considered in my medical decision making (see chart for details).     Rash/mouth sores-reports prior blood work through PCP, will defer further blood work at this time to avoid repeating.  Treating externally for bacterial causes with doxycycline and Bactroban topically, given persistent oral sores placing on Valtrex as well as providing Magic mouthwash to help with oral discomfort.  Encouraged to continue to monitor and follow-up with PCP/dermatology as planned.  Discussed strict return precautions. Patient verbalized understanding and is agreeable with plan.  Final Clinical Impressions(s) / UC Diagnoses   Final diagnoses:  Rash and nonspecific skin eruption  Mouth sores     Discharge Instructions     Please begin doxycycline twice daily for 10 days to help with possible skin infection Bactroban ointment twice daily to areas on face Begin Valtrex 3 times daily for 10 days to help with oral sores Use Magic mouthwash 3 times daily as needed for pain  Please follow-up if not improving or  worsening    ED Prescriptions    Medication Sig Dispense Auth. Provider   doxycycline (VIBRAMYCIN) 100 MG capsule Take 1 capsule (100 mg total) by mouth 2 (two) times daily for 10 days. 20 capsule Lianna Sitzmann C, PA-C   mupirocin ointment (BACTROBAN) 2 % Apply 1 application topically 2 (two) times daily. To skin sores 30 g Vinton Layson C, PA-C   valACYclovir (VALTREX) 1000 MG  tablet Take 1 tablet (1,000 mg total) by mouth 3 (three) times daily for 10 days. 30 tablet [place] Sigmund, [place] C, PA-C   magic mouthwash (nystatin, lidocaine, diphenhydrAMINE, alum & mag hydroxide) suspension Swish and swallow 5 mLs 3 (three) times daily as needed for mouth pain. 180 mL Anastasha Ortez, [place] C, PA-C     PDMP not reviewed this encounter.   Lew Dawes, [place] 04/07/20 1143

## 2020-05-20 DIAGNOSIS — L109 Pemphigus, unspecified: Secondary | ICD-10-CM | POA: Insufficient documentation

## 2020-07-26 ENCOUNTER — Ambulatory Visit
Admission: RE | Admit: 2020-07-26 | Discharge: 2020-07-26 | Disposition: A | Discharge status: Home / Self Care | Payer: BC Managed Care – PPO | Source: Ambulatory Visit | Attending: Family Medicine | Admitting: Family Medicine

## 2020-07-26 ENCOUNTER — Other Ambulatory Visit: Payer: Self-pay

## 2020-07-26 ENCOUNTER — Other Ambulatory Visit: Payer: Self-pay | Admitting: Family Medicine

## 2020-07-26 DIAGNOSIS — R0602 Shortness of breath: Secondary | ICD-10-CM

## 2020-07-26 DIAGNOSIS — R5383 Other fatigue: Secondary | ICD-10-CM

## 2020-08-30 ENCOUNTER — Other Ambulatory Visit: Payer: Self-pay | Admitting: Family Medicine

## 2020-08-30 DIAGNOSIS — M810 Age-related osteoporosis without current pathological fracture: Secondary | ICD-10-CM

## 2020-09-08 ENCOUNTER — Ambulatory Visit
Admission: EM | Admit: 2020-09-08 | Discharge: 2020-09-08 | Disposition: A | Discharge status: Home / Self Care | Payer: BC Managed Care – PPO | Attending: Urgent Care | Admitting: Urgent Care

## 2020-09-08 DIAGNOSIS — R070 Pain in throat: Secondary | ICD-10-CM

## 2020-09-08 DIAGNOSIS — J069 Acute upper respiratory infection, unspecified: Secondary | ICD-10-CM

## 2020-09-08 MED ORDER — BENZONATATE 100 MG PO CAPS: 100.0000 mg | ORAL_CAPSULE | Freq: Three times a day (TID) | ORAL | 0 refills | Status: DC | PRN

## 2020-09-08 MED ORDER — CETIRIZINE HCL 10 MG PO TABS: 10.0000 mg | ORAL_TABLET | Freq: Every day | ORAL | 0 refills | Status: DC

## 2020-09-08 MED ORDER — PSEUDOEPHEDRINE HCL 60 MG PO TABS: 60.0000 mg | ORAL_TABLET | Freq: Three times a day (TID) | ORAL | 0 refills | Status: DC | PRN

## 2020-09-08 MED ORDER — PROMETHAZINE-DM 6.25-15 MG/5ML PO SYRP: 5.0000 mL | ORAL_SOLUTION | Freq: Every evening | ORAL | 0 refills | Status: DC | PRN

## 2020-09-08 NOTE — ED Triage Notes (Signed)
Pt present coughing and sore throat, symptoms started two days ago. Pt state his throat hurts from coughing.

## 2020-09-08 NOTE — ED Provider Notes (Signed)
[facility]   MRN: [id number] DOB: 1970/09/07  Subjective:   Kyle Guzman is a 50 y.o. male presenting for 2-day history of acute onset throat pain, postnasal drainage, cough.  Patient has had 1 sick contact with his son.  He tested negative for COVID-19.  Patient himself does not want to be tested for COVID.  Denies fever, ear pain, chest pain, shortness of breath, wheezing, body aches.  Patient does not have any respiratory disorders.  He is not a smoker.  Denies taking chronic medications.  No Known Allergies  History reviewed. No pertinent past medical history.   History reviewed. No pertinent surgical history.  Family History  Problem Relation Age of Onset   Healthy Mother     Social History   Tobacco Use   Smoking status: Former   Smokeless tobacco: Former    Quit date: 10/27/2010  Vaping Use   Vaping Use: Never used  Substance Use Topics   Alcohol use: No   Drug use: No    ROS   Objective:   Vitals: BP 124/69 (BP Location: Left Arm)   Pulse 83   Temp 98.9 F (37.2 C) (Oral)   Resp 16   SpO2 95%   Physical Exam Constitutional:      General: He is not in acute distress.    Appearance: Normal appearance. He is well-developed and normal weight. He is not ill-appearing, toxic-appearing or diaphoretic.  HENT:     Head: Normocephalic and atraumatic.     Right Ear: Tympanic membrane, ear canal and external ear normal. There is no impacted cerumen.     Left Ear: Tympanic membrane, ear canal and external ear normal. There is no impacted cerumen.     Nose: Nose normal. No congestion or rhinorrhea.     Mouth/Throat:     Mouth: Mucous membranes are moist.     Pharynx: No pharyngeal swelling, oropharyngeal exudate, posterior oropharyngeal erythema or uvula swelling.     Tonsils: No tonsillar exudate or tonsillar abscesses.     Comments: Thick streaks of postnasal drainage overlying pharynx. Eyes:     General: No scleral icterus.       Right eye: No  discharge.        Left eye: No discharge.     Extraocular Movements: Extraocular movements intact.     Conjunctiva/sclera: Conjunctivae normal.     Pupils: Pupils are equal, round, and reactive to light.  Cardiovascular:     Rate and Rhythm: Normal rate and regular rhythm.     Heart sounds: Normal heart sounds. No murmur heard.   No friction rub. No gallop.  Pulmonary:     Effort: Pulmonary effort is normal. No respiratory distress.     Breath sounds: Normal breath sounds. No stridor. No wheezing, rhonchi or rales.  Musculoskeletal:     Cervical back: Normal range of motion and neck supple. No rigidity. No muscular tenderness.  Neurological:     General: No focal deficit present.     Mental Status: He is alert and oriented to person, place, and time.  Psychiatric:        Mood and Affect: Mood normal.        Behavior: Behavior normal.        Thought Content: Thought content normal.    Assessment and Plan :   PDMP not reviewed this encounter.  1. Viral URI with cough   2. Throat pain     Patient declined COVID-19 test and I am in  agreement.  Recommended conservative management, supportive care for viral respiratory illness.  Vital signs hemodynamically stable.  Clear cardiopulmonary exam, deferred imaging as such. Counseled patient on potential for adverse effects with medications prescribed/recommended today, ER and return-to-clinic precautions discussed, patient verbalized understanding.    Wallis Bamberg, PA-C 09/08/20 1133

## 2020-09-18 ENCOUNTER — Encounter (INDEPENDENT_AMBULATORY_CARE_PROVIDER_SITE_OTHER): Payer: Self-pay

## 2020-09-19 ENCOUNTER — Encounter (INDEPENDENT_AMBULATORY_CARE_PROVIDER_SITE_OTHER): Payer: BC Managed Care – PPO | Admitting: Ophthalmology

## 2020-09-26 ENCOUNTER — Emergency Department (HOSPITAL_COMMUNITY): Payer: BC Managed Care – PPO

## 2020-09-26 ENCOUNTER — Emergency Department (HOSPITAL_COMMUNITY)
Admission: EM | Admit: 2020-09-26 | Discharge: 2020-09-26 | Disposition: A | Discharge status: Home / Self Care | Payer: BC Managed Care – PPO | Attending: Emergency Medicine | Admitting: Emergency Medicine

## 2020-09-26 ENCOUNTER — Other Ambulatory Visit: Payer: Self-pay

## 2020-09-26 DIAGNOSIS — E876 Hypokalemia: Secondary | ICD-10-CM | POA: Insufficient documentation

## 2020-09-26 DIAGNOSIS — R0981 Nasal congestion: Secondary | ICD-10-CM | POA: Diagnosis not present

## 2020-09-26 DIAGNOSIS — U071 COVID-19: Secondary | ICD-10-CM

## 2020-09-26 DIAGNOSIS — Z87891 Personal history of nicotine dependence: Secondary | ICD-10-CM | POA: Insufficient documentation

## 2020-09-26 DIAGNOSIS — J189 Pneumonia, unspecified organism: Secondary | ICD-10-CM

## 2020-09-26 DIAGNOSIS — R0602 Shortness of breath: Secondary | ICD-10-CM | POA: Diagnosis present

## 2020-09-26 LAB — COMPREHENSIVE METABOLIC PANEL
ALT: 13 U/L (ref 0–44)
AST: 16 U/L (ref 15–41)
Albumin: 3.3 g/dL — ABNORMAL LOW (ref 3.5–5.0)
Alkaline Phosphatase: 47 U/L (ref 38–126)
Anion gap: 11 (ref 5–15)
BUN: 15 mg/dL (ref 6–20)
CO2: 23 mmol/L (ref 22–32)
Calcium: 8.6 mg/dL — ABNORMAL LOW (ref 8.9–10.3)
Chloride: 102 mmol/L (ref 98–111)
Creatinine, Ser: 0.83 mg/dL (ref 0.61–1.24)
GFR, Estimated: >60 mL/min (ref >60)
Glucose, Bld: 99 mg/dL (ref 70–99)
Potassium: 3 mmol/L — ABNORMAL LOW (ref 3.5–5.1)
Sodium: 136 mmol/L (ref 135–145)
Total Bilirubin: 0.3 mg/dL (ref 0.3–1.2)
Total Protein: 6.3 g/dL — ABNORMAL LOW (ref 6.5–8.1)

## 2020-09-26 LAB — CBC WITH DIFFERENTIAL/PLATELET
Abs Immature Granulocytes: 0.08 10*3/uL — ABNORMAL HIGH (ref 0.00–0.07)
Basophils Absolute: 0 10*3/uL (ref 0.0–0.1)
Basophils Relative: 0 %
Eosinophils Absolute: 0 10*3/uL (ref 0.0–0.5)
Eosinophils Relative: 0 %
HCT: 45.2 % (ref 39.0–52.0)
Hemoglobin: 14.2 g/dL (ref 13.0–17.0)
Immature Granulocytes: 1 %
Lymphocytes Relative: 29 %
Lymphs Abs: 2 10*3/uL (ref 0.7–4.0)
MCH: 29.2 pg (ref 26.0–34.0)
MCHC: 31.4 g/dL (ref 30.0–36.0)
MCV: 93 fL (ref 80.0–100.0)
Monocytes Absolute: 0.8 10*3/uL (ref 0.1–1.0)
Monocytes Relative: 12 %
Neutro Abs: 3.9 10*3/uL (ref 1.7–7.7)
Neutrophils Relative %: 58 %
Platelets: 363 10*3/uL (ref 150–400)
RBC: 4.86 MIL/uL (ref 4.22–5.81)
RDW: 12.4 % (ref 11.5–15.5)
WBC: 6.8 10*3/uL (ref 4.0–10.5)
nRBC: 0 % (ref 0.0–0.2)

## 2020-09-26 LAB — RESP PANEL BY RT-PCR (FLU A&B, COVID) ARPGX2
Influenza A by PCR: NEGATIVE
Influenza B by PCR: NEGATIVE
SARS Coronavirus 2 by RT PCR: POSITIVE — AB

## 2020-09-26 LAB — TROPONIN I (HIGH SENSITIVITY)
Troponin I (High Sensitivity): 7 ng/L (ref <18)
Troponin I (High Sensitivity): 7 ng/L (ref <18)

## 2020-09-26 MED ORDER — AMOXICILLIN 500 MG PO CAPS: 1000.0000 mg | ORAL_CAPSULE | Freq: Three times a day (TID) | ORAL | 0 refills | Status: DC

## 2020-09-26 MED ORDER — POTASSIUM CHLORIDE CRYS ER 20 MEQ PO TBCR
40.0000 meq | EXTENDED_RELEASE_TABLET | Freq: Once | ORAL | Status: AC
  Administered 2020-09-26: 40 meq via ORAL
  Filled 2020-09-26: qty 2

## 2020-09-26 MED ORDER — ACETAMINOPHEN 325 MG PO TABS
650.0000 mg | ORAL_TABLET | Freq: Once | ORAL | Status: AC
  Administered 2020-09-26: 650 mg via ORAL
  Filled 2020-09-26: qty 2

## 2020-09-26 MED ORDER — AZITHROMYCIN 250 MG PO TABS: 250.0000 mg | ORAL_TABLET | Freq: Every day | ORAL | 0 refills | Status: DC

## 2020-09-26 MED ORDER — POTASSIUM CHLORIDE CRYS ER 20 MEQ PO TBCR: 40.0000 meq | EXTENDED_RELEASE_TABLET | Freq: Two times a day (BID) | ORAL | 0 refills | Status: DC

## 2020-09-26 NOTE — ED Triage Notes (Signed)
C/o cough, congestion, and shortness of breathe, pain when taking a deep breathe as well

## 2020-09-26 NOTE — Discharge Instructions (Addendum)
Amoxicillin 3 times orally the first day.  On the second day take 2 pills of the azithromycin.  After that take 1 pill until the azithromycin is finished. Potassium was low here in the ED.  Starting tomorrow take 40 mg of potassium twice daily for 2 weeks. Follow-up with your primary care doctor in 2 week to ensure things are improved and to recheck your potassium levels. If things change or worsen return back to the ED as needed.

## 2020-09-26 NOTE — ED Provider Notes (Signed)
Emergency Medicine Provider Triage Evaluation Note  Elman Dettman , a 50 y.o. male  was evaluated in triage.  Pt complains of cough congestion feeling short of breath.  States subjective fevers and chills at home.  States he has been vaccinated against COVID x2.  Denies any chest pain apart from the coughing.  Denies any hemoptysis.  States he is not an active smoker.  Review of Systems  Positive: Cough, congestion, CP Negative: Vomiting   Physical Exam  BP 105/89 (BP Location: Right Arm)   Pulse 98   Temp 99.5 F (37.5 C) (Oral)   Resp 16   SpO2 96%  Gen:   Awake, no distress   Resp:  Normal effort  MSK:   Moves extremities without difficulty  Other:  Uncomfortable appearing.  Medical Decision Making  Medically screening exam initiated at 8:46 AM.  Appropriate orders placed.  Danthony Kendrix was informed that the remainder of the evaluation will be completed by another provider, this initial triage assessment does not replace that evaluation, and the importance of remaining in the ED until their evaluation is complete.    Solon Augusta Colp, [place] 09/26/20 8250    Gwyneth Sprout, MD 09/26/20 1318

## 2020-09-26 NOTE — ED Provider Notes (Signed)
[facility] EMERGENCY DEPARTMENT Provider Note   CSN: [id number] Arrival date & time: 09/26/20  9833     History Chief Complaint  Patient presents with   Shortness of Breath    Kyle Guzman is a 50 y.o. male.  HPI  Patient presents with cough, congestion, shortness of breath x roughly10 days.  He is COVID vaccinated x2.  He has tried Tylenol with minimal relief.  He reports that he also has some chest tightness that comes and goes and is associated with coughing and nasal congestion.  He has had some nausea but no vomiting.  No history of asthma, COPD, cardiac events, MI, blood clots.  Patient was seen 09/08/2020 at an urgent care for nasal congestion.  At that time he declined COVID test, his son had having to take a COVID test that was negative at home.    No past medical history on file.  There are no problems to display for this patient.   No past surgical history on file.     Family History  Problem Relation Age of Onset   Healthy Mother     Social History   Tobacco Use   Smoking status: Former   Smokeless tobacco: Former    Quit date: 10/27/2010  Vaping Use   Vaping Use: Never used  Substance Use Topics   Alcohol use: No   Drug use: No    Home Medications Prior to Admission medications   Medication Sig Start Date End Date Taking? Authorizing Provider  benzonatate (TESSALON) 100 MG capsule Take 1-2 capsules (100-200 mg total) by mouth 3 (three) times daily as needed. 09/08/20   Wallis Bamberg, PA-C  cetirizine (ZYRTEC ALLERGY) 10 MG tablet Take 1 tablet (10 mg total) by mouth daily. 09/08/20   Wallis Bamberg, PA-C  magic mouthwash (nystatin, lidocaine, diphenhydrAMINE, alum & mag hydroxide) suspension Swish and swallow 5 mLs 3 (three) times daily as needed for mouth pain. 04/07/20   Wieters, Hallie C, PA-C  mupirocin ointment (BACTROBAN) 2 % Apply 1 application topically 2 (two) times daily. To skin sores 04/07/20   Wieters, [employer] C, PA-C   promethazine-dextromethorphan (PROMETHAZINE-DM) 6.25-15 MG/5ML syrup Take 5 mLs by mouth at bedtime as needed for cough. 09/08/20   Wallis Bamberg, PA-C  pseudoephedrine (SUDAFED) 60 MG tablet Take 1 tablet (60 mg total) by mouth every 8 (eight) hours as needed for congestion. 09/08/20   Wallis Bamberg, PA-C    Allergies    Patient has no known allergies.  Review of Systems   Review of Systems  Constitutional:  Positive for fever.  HENT:  Positive for congestion.   Respiratory:  Positive for cough, chest tightness and shortness of breath.   Cardiovascular:  Positive for chest pain. Negative for leg swelling.  Gastrointestinal:  Positive for nausea. Negative for abdominal pain and vomiting.  Musculoskeletal:  Positive for myalgias.  Neurological:  Negative for headaches.   Physical Exam Updated Vital Signs BP 105/65 (BP Location: Right Arm)   Pulse (!) 102   Temp 99.7 F (37.6 C) (Oral)   Resp 17   SpO2 96%   Physical Exam Vitals and nursing note reviewed. Exam conducted with a chaperone present.  Constitutional:      General: He is not in acute distress.    Appearance: Normal appearance.     Comments: Patient is uncomfortable appearing  HENT:     Head: Normocephalic and atraumatic.  Eyes:     General: No scleral icterus.  Extraocular Movements: Extraocular movements intact.     Pupils: Pupils are equal, round, and reactive to light.  Cardiovascular:     Rate and Rhythm: Normal rate and regular rhythm.  Pulmonary:     Effort: Pulmonary effort is normal. No tachypnea or accessory muscle usage.     Breath sounds: Examination of the left-middle field reveals rales. Rales present.     Comments: Patient has some mild rales in the mid left lung, lungs are otherwise clear to auscultation.  There is no tachypnea or accessory muscle use.  He is able to speak in complete sentences. Chest:     Chest wall: No tenderness.  Musculoskeletal:     Right lower leg: No edema.     Left lower  leg: No edema.  Skin:    Coloration: Skin is not jaundiced.  Neurological:     Mental Status: He is alert. Mental status is at baseline.     Coordination: Coordination normal.    ED Results / Procedures / Treatments   Labs (all labs ordered are listed, but only abnormal results are displayed) Labs Reviewed  RESP PANEL BY RT-PCR (FLU A&B, COVID) ARPGX2 - Abnormal; Notable for the following components:      Result Value   SARS Coronavirus 2 by RT PCR POSITIVE (*)    All other components within normal limits  CBC WITH DIFFERENTIAL/PLATELET - Abnormal; Notable for the following components:   Abs Immature Granulocytes 0.08 (*)    All other components within normal limits  COMPREHENSIVE METABOLIC PANEL - Abnormal; Notable for the following components:   Potassium 3.0 (*)    Calcium 8.6 (*)    Total Protein 6.3 (*)    Albumin 3.3 (*)    All other components within normal limits  TROPONIN I (HIGH SENSITIVITY)  TROPONIN I (HIGH SENSITIVITY)    EKG None  Radiology DG Chest 2 View  Result Date: 09/26/2020 CLINICAL DATA:  Shortness of breath. Cough, congestion, pain, shortness of breath and fever. EXAM: CHEST - 2 VIEW COMPARISON:  July 26, 2020. FINDINGS: Slightly increased conspicuity of left greater than right bibasilar opacities. No visible pleural effusions or pneumothorax. Similar cardiomediastinal silhouette, within normal limits. No acute osseous abnormality. IMPRESSION: Slightly increased conspicuity of left greater than right bibasilar airspace opacities, suspicious for pneumonia given the clinical history. Followup PA and lateral chest X-ray is recommended in 3-4 weeks following trial of antibiotic therapy to ensure resolution. Electronically Signed   By: Feliberto Harts M.D.   On: 09/26/2020 09:19    Procedures Procedures   Medications Ordered in ED Medications  acetaminophen (TYLENOL) tablet 650 mg (650 mg Oral Given 09/26/20 1314)    ED Course  I have reviewed the triage  vital signs and the nursing notes.  Pertinent labs & imaging results that were available during my care of the patient were reviewed by me and considered in my medical decision making (see chart for details).  Clinical Course as of 09/26/20 1832  Thu Sep 26, 2020  1709 We will check second troponin, first was negative in any ST elevations or depressions concerning for ACS.  Overall very reassuring. [HS]  1722 CBC with Differential/Platelet(!) No leukocytosis, no anemia [HS]    Clinical Course User Index [HS] Theron Arista, PA-C   MDM Rules/Calculators/A&P                           Patient initially was febrile and mildly tachycardic on intake.  It is improved with Tylenol.  He is satting at 98% on room air without any tachypnea or accessory muscle use.  Not in any signs of respiratory distress, he does have evidence on the radiograph of pneumonia as well as a COVID positive COVID test.  He looks uncomfortable, but he is tolerating oral intake.  No comorbidities, will evaluate for troponin given he is 50 years old.  However, EKG is normal without any ST elevations or depressions.  No cardiac history, and the chest pain is worse with inspiration so seems more pleuritic than cardiac etiology.  Patient is mildly hypokalemic, will give K-Dur and give oral supplementation 4.  Started patient on antibiotics as well for the pneumonia.  He has had symptoms for greater than 10 days and does not have any comorbidities, not a candidate for antiviral COVID medicine.  Will treat for pneumonia, return precautions given.  Patient appropriate for discharge at this time.  No signs of respiratory distress or hypoxia.  Final Clinical Impression(s) / ED Diagnoses Final diagnoses:  None    Rx / DC Orders ED Discharge Orders     None        Izzy, [place], Cordelia Poche 09/26/20 1832    Arby Barrette, MD 09/26/20 2351

## 2020-09-30 ENCOUNTER — Ambulatory Visit
Admission: EM | Admit: 2020-09-30 | Discharge: 2020-09-30 | Disposition: A | Discharge status: Home / Self Care | Payer: BC Managed Care – PPO | Attending: Urgent Care | Admitting: Urgent Care

## 2020-09-30 ENCOUNTER — Encounter: Payer: Self-pay | Admitting: Emergency Medicine

## 2020-09-30 ENCOUNTER — Other Ambulatory Visit: Payer: Self-pay

## 2020-09-30 DIAGNOSIS — R079 Chest pain, unspecified: Secondary | ICD-10-CM

## 2020-09-30 DIAGNOSIS — U071 COVID-19: Secondary | ICD-10-CM

## 2020-09-30 DIAGNOSIS — R9389 Abnormal findings on diagnostic imaging of other specified body structures: Secondary | ICD-10-CM

## 2020-09-30 DIAGNOSIS — R059 Cough, unspecified: Secondary | ICD-10-CM

## 2020-09-30 MED ORDER — BENZONATATE 100 MG PO CAPS: 100.0000 mg | ORAL_CAPSULE | Freq: Three times a day (TID) | ORAL | 0 refills | Status: DC | PRN

## 2020-09-30 MED ORDER — AMOXICILLIN 500 MG PO CAPS: 1000.0000 mg | ORAL_CAPSULE | Freq: Three times a day (TID) | ORAL | 0 refills | Status: AC

## 2020-09-30 MED ORDER — PROMETHAZINE-DM 6.25-15 MG/5ML PO SYRP: 5.0000 mL | ORAL_SOLUTION | Freq: Every evening | ORAL | 0 refills | Status: DC | PRN

## 2020-09-30 NOTE — ED Provider Notes (Signed)
[facility]   MRN: [id number] DOB: 11/15/1970  Subjective:   Kyle Guzman is a 50 y.o. male presenting for 2-day history of acute onset right sided lateral chest pain.  Symptoms are elicited from coughing.  He is actually COVID-positive, was seen on 09/26/2020.  His chest x-ray showed a possible pneumonia and therefore was supposed to complete treatment with azithromycin and amoxicillin.  He did finish the azithromycin.  However, when he was at the pharmacy he only got 3 capsules of amoxicillin.  Would like to make sure that he gets the right medication.  Denies history of smoking, respiratory disorders.  No current facility-administered medications for this encounter.  Current Outpatient Medications:    amoxicillin (AMOXIL) 500 MG capsule, Take 2 capsules (1,000 mg total) by mouth 3 (three) times daily., Disp: 3 capsule, Rfl: 0   azithromycin (ZITHROMAX) 250 MG tablet, Take 1 tablet (250 mg total) by mouth daily. Take first 2 tablets together, then 1 every day until finished., Disp: 6 tablet, Rfl: 0   benzonatate (TESSALON) 100 MG capsule, Take 1-2 capsules (100-200 mg total) by mouth 3 (three) times daily as needed., Disp: 60 capsule, Rfl: 0   cetirizine (ZYRTEC ALLERGY) 10 MG tablet, Take 1 tablet (10 mg total) by mouth daily., Disp: 30 tablet, Rfl: 0   magic mouthwash (nystatin, lidocaine, diphenhydrAMINE, alum & mag hydroxide) suspension, Swish and swallow 5 mLs 3 (three) times daily as needed for mouth pain., Disp: 180 mL, Rfl: 0   mupirocin ointment (BACTROBAN) 2 %, Apply 1 application topically 2 (two) times daily. To skin sores, Disp: 30 g, Rfl: 0   potassium chloride SA (KLOR-CON) 20 MEQ tablet, Take 2 tablets (40 mEq total) by mouth 2 (two) times daily., Disp: 14 tablet, Rfl: 0   promethazine-dextromethorphan (PROMETHAZINE-DM) 6.25-15 MG/5ML syrup, Take 5 mLs by mouth at bedtime as needed for cough., Disp: 100 mL, Rfl: 0   pseudoephedrine (SUDAFED) 60 MG tablet, Take 1 tablet  (60 mg total) by mouth every 8 (eight) hours as needed for congestion., Disp: 30 tablet, Rfl: 0   No Known Allergies  History reviewed. No pertinent past medical history.   History reviewed. No pertinent surgical history.  Family History  Problem Relation Age of Onset   Healthy Mother     Social History   Tobacco Use   Smoking status: Former   Smokeless tobacco: Former    Quit date: 10/27/2010  Vaping Use   Vaping Use: Never used  Substance Use Topics   Alcohol use: No   Drug use: No    ROS   Objective:   Vitals: BP 112/75 (BP Location: Left Arm)   Pulse 92   Temp 98.7 F (37.1 C) (Oral)   Ht 5\' 6"  (1.676 m)   SpO2 97%   BMI 23.20 kg/m   Physical Exam Constitutional:      General: He is not in acute distress.    Appearance: Normal appearance. He is well-developed. He is not ill-appearing, toxic-appearing or diaphoretic.  HENT:     Head: Normocephalic and atraumatic.     Right Ear: External ear normal.     Left Ear: External ear normal.     Nose: Nose normal.     Mouth/Throat:     Mouth: Mucous membranes are moist.     Pharynx: Oropharynx is clear.  Eyes:     General: No scleral icterus.    Extraocular Movements: Extraocular movements intact.     Pupils: Pupils are equal, round, and  reactive to light.  Cardiovascular:     Rate and Rhythm: Normal rate and regular rhythm.     Heart sounds: Normal heart sounds. No murmur heard.   No friction rub. No gallop.  Pulmonary:     Effort: Pulmonary effort is normal. No respiratory distress.     Breath sounds: Normal breath sounds. No stridor. No wheezing, rhonchi or rales.  Chest:     Chest wall: No tenderness.  Skin:    General: Skin is warm and dry.  Neurological:     Mental Status: He is alert and oriented to person, place, and time.  Psychiatric:        Mood and Affect: Mood normal.        Behavior: Behavior normal.        Thought Content: Thought content normal.     Assessment and Plan :   PDMP  not reviewed this encounter.  1. COVID-19   2. Abnormal chest x-ray   3. Right-sided chest pain   4. Cough     Patient needs management for COVID-19 with supportive care.  I will provide him with the correct number of capsules for treatment of an underlying pneumonia with amoxicillin.  He finished azithromycin and therefore will not refill this.  Refilled cough medications. Counseled patient on potential for adverse effects with medications prescribed/recommended today, ER and return-to-clinic precautions discussed, patient verbalized understanding.    Wallis Bamberg, PA-C 09/30/20 1029

## 2020-09-30 NOTE — ED Triage Notes (Signed)
Patient c/o productive cough and right sided pain x 2 days.  Patient denies any other sx's.  Patient is vaccinated for COVID.

## 2020-09-30 NOTE — Discharge Instructions (Signed)
For sore throat or cough try using a honey-based tea. Use 3 teaspoons of honey with juice squeezed from half lemon. Place shaved pieces of ginger into 1/2-1 cup of water and warm over stove top. Then mix the ingredients and repeat every 4 hours as needed. Please take Tylenol 500mg -650mg  every 6 hours for aches and pains, fevers. Hydrate very well with at least 2 liters of water. Eat light meals such as soups to replenish electrolytes and soft fruits, veggies. Start an antihistamine like Zyrtec, Allegra or Claritin for postnasal drainage, sinus congestion.  You can take this together with pseudoephedrine (Sudafed) at a dose of 60 mg 2-3 times a day as needed for the same kind of congestion.

## 2020-10-01 ENCOUNTER — Ambulatory Visit (INDEPENDENT_AMBULATORY_CARE_PROVIDER_SITE_OTHER): Payer: BC Managed Care – PPO | Admitting: Ophthalmology

## 2020-10-01 ENCOUNTER — Encounter (INDEPENDENT_AMBULATORY_CARE_PROVIDER_SITE_OTHER): Payer: Self-pay | Admitting: Ophthalmology

## 2020-10-01 DIAGNOSIS — H35711 Central serous chorioretinopathy, right eye: Secondary | ICD-10-CM

## 2020-10-01 MED ORDER — FLUORESCEIN SODIUM 10 % IV SOLN
500.0000 mg | INTRAVENOUS | Status: AC | PRN
  Administered 2020-10-01: 500 mg via INTRAVENOUS

## 2020-10-01 NOTE — Progress Notes (Signed)
10/01/2020     CHIEF COMPLAINT Patient presents for  Chief Complaint  Patient presents with   Retina Evaluation      HISTORY OF PRESENT ILLNESS: Kyle Guzman is a 50 y.o. male who presents to the clinic today for:   HPI     Retina Evaluation   In right eye.  This started months ago.  Duration of months.  Associated Symptoms Floaters and Blind Spot ("dark in my right eye, not blind spot but not clear either.").        Comments   NP CSCR referred by Dr. Fabian Sharp.   Pt states he saw Dr Dione Booze for a cataract evaluation and was then referred to Dr. Luciana Axe. Per Dr. Laruth Bouchard note, CSCR OD. Pt states his vision is not good in the right eye and has not been for a few months.  No interval change, persistent poor vision in the right eye      Last edited by Edmon Crape, MD on 10/01/2020  9:21 AM.      Referring physician: Kaleen Mask, MD [address] [place],  [place] [address]  HISTORICAL INFORMATION:   Selected notes from the MEDICAL RECORD NUMBER       CURRENT MEDICATIONS: No current outpatient medications on file. (Ophthalmic Drugs)   No current facility-administered medications for this visit. (Ophthalmic Drugs)   Current Outpatient Medications (Other)  Medication Sig   amoxicillin (AMOXIL) 500 MG capsule Take 2 capsules (1,000 mg total) by mouth 3 (three) times daily for 10 days.   azithromycin (ZITHROMAX) 250 MG tablet Take 1 tablet (250 mg total) by mouth daily. Take first 2 tablets together, then 1 every day until finished.   benzonatate (TESSALON) 100 MG capsule Take 1-2 capsules (100-200 mg total) by mouth 3 (three) times daily as needed.   cetirizine (ZYRTEC ALLERGY) 10 MG tablet Take 1 tablet (10 mg total) by mouth daily.   magic mouthwash (nystatin, lidocaine, diphenhydrAMINE, alum & mag hydroxide) suspension Swish and swallow 5 mLs 3 (three) times daily as needed for mouth pain.   mupirocin ointment (BACTROBAN) 2 % Apply 1 application topically 2  (two) times daily. To skin sores   potassium chloride SA (KLOR-CON) 20 MEQ tablet Take 2 tablets (40 mEq total) by mouth 2 (two) times daily.   promethazine-dextromethorphan (PROMETHAZINE-DM) 6.25-15 MG/5ML syrup Take 5 mLs by mouth at bedtime as needed for cough.   pseudoephedrine (SUDAFED) 60 MG tablet Take 1 tablet (60 mg total) by mouth every 8 (eight) hours as needed for congestion.   No current facility-administered medications for this visit. (Other)      REVIEW OF SYSTEMS:    ALLERGIES No Known Allergies  PAST MEDICAL HISTORY History reviewed. No pertinent past medical history. History reviewed. No pertinent surgical history.  FAMILY HISTORY Family History  Problem Relation Age of Onset   Healthy Mother     SOCIAL HISTORY Social History   Tobacco Use   Smoking status: Former   Smokeless tobacco: Former    Quit date: 10/27/2010  Vaping Use   Vaping Use: Never used  Substance Use Topics   Alcohol use: No   Drug use: No         OPHTHALMIC EXAM:  Base Eye Exam     Visual Acuity (Snellen - Linear)       Right Left   Dist cc 20/50 -1 20/20 -1   Dist ph cc 20/50 +2  Tonometry (Tonopen, 8:30 AM)       Right Left   Pressure 19 19         Pupils       Pupils Dark Light APD   Right PERRL 4 3 None   Left PERRL 4 3 None         Visual Fields (Counting fingers)       Left Right    Full Full         Extraocular Movement       Right Left    Full Full         Neuro/Psych     Oriented x3: Yes   Mood/Affect: Normal         Dilation     Both eyes: 1.0% Mydriacyl, 2.5% Phenylephrine @ 8:30 AM           Slit Lamp and Fundus Exam     External Exam       Right Left   External Normal Normal         Slit Lamp Exam       Right Left   Lids/Lashes Normal Normal   Conjunctiva/Sclera White and quiet White and quiet   Cornea Clear Clear   Anterior Chamber Deep and quiet Deep and quiet   Iris Round and reactive  Round and reactive   Lens Clear Clear   Anterior Vitreous Normal Normal         Fundus Exam       Right Left   Posterior Vitreous Normal Normal   Disc Normal Normal   C/D Ratio 0.25 0.25   Macula Macular thickening, with serous retinal detachment in the fovea Normal   Vessels Normal Normal   Periphery Normal Normal            IMAGING AND PROCEDURES  Imaging and Procedures for 10/01/20  OCT, Retina - OU - Both Eyes       Right Eye Quality was good. Scan locations included subfoveal. Central Foveal Thickness: 497. Findings include abnormal foveal contour, subretinal fluid.   Left Eye Central Foveal Thickness: 263. Progression has no prior data. Findings include normal foveal contour.   Notes Serous subfoveal detachment classic CSCR     Color Fundus Photography Optos - OU - Both Eyes       Right Eye Progression has no prior data. Disc findings include normal observations. Vessels : normal observations. Periphery : normal observations.   Left Eye Progression has no prior data. Disc findings include normal observations. Macula : normal observations. Vessels : normal observations. Periphery : normal observations.   Notes Serous elevation of the fovea, will need fluorescein angiography     Fluorescein Angiography Optos (Transit OD)       Injection: 500 mg Fluorescein Sodium 10 %   Route: Intravenous   NDC: [phone]   Right Eye   Progression has no prior data. Early phase findings include window defect, leakage. Mid/Late phase findings include window defect, leakage. Choroidal neovascularization is not present.   Left Eye   Progression has no prior data. Mid/Late phase findings include normal observations. Choroidal neovascularization is not present.   Notes OD with focal CS CR leakage inferonasal to FAZ, approximately 1000-1100 m from the [facility] center.  Serous elevation classic for CS CR.  With longstanding duration of symptoms with no  improvement we will offer focal laser ablation with gentle navigated laser, micro pulse therapy, NAVILAS  ASSESSMENT/PLAN:  Acute central serous retinopathy with subretinal fluid, right We will proceed with fluorescein angiography to look for treatable lesion     ICD-10-CM   1. Acute central serous retinopathy with subretinal fluid, right  H35.711 OCT, Retina - OU - Both Eyes    Color Fundus Photography Optos - OU - Both Eyes    Fluorescein Angiography Optos (Transit OD)    Fluorescein Sodium 10 % injection 500 mg      1.  OD with chronic ongoing symptomatic central serous retinopathy with a focal hotspot outside the foveal avascular zone.  2.  Fluorescein angiography confirms these findings and we will schedule navigated laser, NAVILAS micro pulse therapy to hasten resolution of this condition and minimize long-term scarring  3.  Ophthalmic Meds Ordered this visit:  Meds ordered this encounter  Medications   Fluorescein Sodium 10 % injection 500 mg       Return in about 1 week (around 10/08/2020) for dilate, OD, FOCAL using current FFA guidance.  There are no Patient Instructions on file for this visit.   Explained the diagnoses, plan, and follow up with the patient and they expressed understanding.  Patient expressed understanding of the importance of proper follow up care.   Alford Highland Dijon Cosens M.D. Diseases & Surgery of the Retina and Vitreous [facility] 10/01/20     Abbreviations: M myopia (nearsighted); A astigmatism; H hyperopia (farsighted); P presbyopia; Mrx spectacle prescription;  CTL contact lenses; OD right eye; OS left eye; OU both eyes  XT exotropia; ET esotropia; PEK punctate epithelial keratitis; PEE punctate epithelial erosions; DES dry eye syndrome; MGD meibomian gland dysfunction; ATs artificial tears; PFAT's preservative free artificial tears; NSC nuclear sclerotic cataract; PSC posterior subcapsular cataract; ERM  epi-retinal membrane; PVD posterior vitreous detachment; RD retinal detachment; DM diabetes mellitus; DR diabetic retinopathy; NPDR non-proliferative diabetic retinopathy; PDR proliferative diabetic retinopathy; CSME clinically significant macular edema; DME diabetic macular edema; dbh dot blot hemorrhages; CWS cotton wool spot; POAG primary open angle glaucoma; C/D cup-to-disc ratio; HVF humphrey visual field; GVF goldmann visual field; OCT optical coherence tomography; IOP intraocular pressure; BRVO Branch retinal vein occlusion; CRVO central retinal vein occlusion; CRAO central retinal artery occlusion; BRAO branch retinal artery occlusion; RT retinal tear; SB scleral buckle; PPV pars plana vitrectomy; VH Vitreous hemorrhage; PRP panretinal laser photocoagulation; IVK intravitreal kenalog; VMT vitreomacular traction; MH Macular hole;  NVD neovascularization of the disc; NVE neovascularization elsewhere; AREDS age related eye disease study; ARMD age related macular degeneration; POAG primary open angle glaucoma; EBMD epithelial/anterior basement membrane dystrophy; ACIOL anterior chamber intraocular lens; IOL intraocular lens; PCIOL posterior chamber intraocular lens; Phaco/IOL phacoemulsification with intraocular lens placement; PRK photorefractive keratectomy; LASIK laser assisted in situ keratomileusis; HTN hypertension; DM diabetes mellitus; COPD chronic obstructive pulmonary disease

## 2020-10-01 NOTE — Assessment & Plan Note (Signed)
We will proceed with fluorescein angiography to look for treatable lesion

## 2020-10-09 ENCOUNTER — Ambulatory Visit (INDEPENDENT_AMBULATORY_CARE_PROVIDER_SITE_OTHER): Payer: BC Managed Care – PPO | Admitting: Ophthalmology

## 2020-10-09 ENCOUNTER — Encounter (INDEPENDENT_AMBULATORY_CARE_PROVIDER_SITE_OTHER): Payer: Self-pay | Admitting: Ophthalmology

## 2020-10-09 ENCOUNTER — Other Ambulatory Visit: Payer: Self-pay

## 2020-10-09 ENCOUNTER — Encounter (INDEPENDENT_AMBULATORY_CARE_PROVIDER_SITE_OTHER): Payer: BC Managed Care – PPO | Admitting: Ophthalmology

## 2020-10-09 DIAGNOSIS — H35711 Central serous chorioretinopathy, right eye: Secondary | ICD-10-CM | POA: Diagnosis not present

## 2020-10-09 NOTE — Assessment & Plan Note (Signed)
Focal laser applied today, follow-up in 6 to 7 weeks to reassess

## 2020-10-09 NOTE — Progress Notes (Signed)
10/09/2020     CHIEF COMPLAINT Patient presents for  Chief Complaint  Patient presents with   Retina Follow Up      HISTORY OF PRESENT ILLNESS: Kyle Guzman is a 50 y.o. male who presents to the clinic today for:   HPI     Retina Follow Up   Patient presents with  Other.  In right eye.  This started 1 week ago.  Duration of 1 week.        Comments   1 week Focal OD with FFA guidance  Pt c/o fluctuating vision in the right eye. Pt denies any new flashes or floaters. Pt denies any eye pain. Pt does c/o tearing and dryness.  Eye Meds: None      Last edited by Frederik Pear, COA on 10/09/2020  9:07 AM.      Referring physician: Kaleen Mask, MD [id number] [place],  [place] [address]  HISTORICAL INFORMATION:   Selected notes from the MEDICAL RECORD NUMBER       CURRENT MEDICATIONS: No current outpatient medications on file. (Ophthalmic Drugs)   No current facility-administered medications for this visit. (Ophthalmic Drugs)   Current Outpatient Medications (Other)  Medication Sig   amoxicillin (AMOXIL) 500 MG capsule Take 2 capsules (1,000 mg total) by mouth 3 (three) times daily for 10 days.   azithromycin (ZITHROMAX) 250 MG tablet Take 1 tablet (250 mg total) by mouth daily. Take first 2 tablets together, then 1 every day until finished.   benzonatate (TESSALON) 100 MG capsule Take 1-2 capsules (100-200 mg total) by mouth 3 (three) times daily as needed.   cetirizine (ZYRTEC ALLERGY) 10 MG tablet Take 1 tablet (10 mg total) by mouth daily.   magic mouthwash (nystatin, lidocaine, diphenhydrAMINE, alum & mag hydroxide) suspension Swish and swallow 5 mLs 3 (three) times daily as needed for mouth pain.   mupirocin ointment (BACTROBAN) 2 % Apply 1 application topically 2 (two) times daily. To skin sores   potassium chloride SA (KLOR-CON) 20 MEQ tablet Take 2 tablets (40 mEq total) by mouth 2 (two) times daily.   promethazine-dextromethorphan  (PROMETHAZINE-DM) 6.25-15 MG/5ML syrup Take 5 mLs by mouth at bedtime as needed for cough.   pseudoephedrine (SUDAFED) 60 MG tablet Take 1 tablet (60 mg total) by mouth every 8 (eight) hours as needed for congestion.   No current facility-administered medications for this visit. (Other)      REVIEW OF SYSTEMS:    ALLERGIES No Known Allergies  PAST MEDICAL HISTORY History reviewed. No pertinent past medical history. History reviewed. No pertinent surgical history.  FAMILY HISTORY Family History  Problem Relation Age of Onset   Healthy Mother     SOCIAL HISTORY Social History   Tobacco Use   Smoking status: Former   Smokeless tobacco: Former    Quit date: 10/27/2010  Vaping Use   Vaping Use: Never used  Substance Use Topics   Alcohol use: No   Drug use: No         OPHTHALMIC EXAM:  Base Eye Exam     Visual Acuity (ETDRS)       Right Left   Dist cc 20/50 +2 20/25 -1   Dist ph cc 20/25 -1     Correction: Glasses         Tonometry (Tonopen, 9:13 AM)       Right Left   Pressure 19 18         Pupils  Pupils Dark Light Shape React APD   Right PERRL 6 5 Round Brisk None   Left PERRL 6 5 Round Brisk None         Visual Fields (Counting fingers)       Left Right    Full Full         Extraocular Movement       Right Left    Full, Ortho Full, Ortho         Neuro/Psych     Oriented x3: Yes   Mood/Affect: Normal         Dilation     Right eye: 1.0% Mydriacyl, 2.5% Phenylephrine @ 9:13 AM           Slit Lamp and Fundus Exam     External Exam       Right Left   External Normal Normal         Slit Lamp Exam       Right Left   Lids/Lashes Normal Normal   Conjunctiva/Sclera White and quiet White and quiet   Cornea Clear Clear   Anterior Chamber Deep and quiet Deep and quiet   Iris Round and reactive Round and reactive   Lens Clear Clear   Anterior Vitreous Normal Normal         Fundus Exam       Right  Left   Posterior Vitreous Normal    Disc Normal    C/D Ratio 0.25    Macula Macular thickening, with serous retinal detachment in the fovea    Vessels Normal    Periphery Normal             IMAGING AND PROCEDURES  Imaging and Procedures for 10/09/20  Focal Laser - OD - Right Eye       Time Out Confirmed correct patient, procedure, site, and patient consented.   Anesthesia Topical anesthesia was used. Anesthetic medications included Proparacaine 0.5%.   Laser Information The type of laser was diode. Color was yellow. The duration in seconds was 0.1. The spot size was 100 microns. Laser power was 800. Total spots was 42.   Post-op The patient tolerated the procedure well. There were no complications. The patient received written and verbal post procedure care education.   Notes 5% duty cycle reduces the duration to .05 seconds, focal guided by FFA micro pulse navigated laser to CS CR leaking spot inferonasal to FAZ             ASSESSMENT/PLAN:  Acute central serous retinopathy with subretinal fluid, right Focal laser applied today, follow-up in 6 to 7 weeks to reassess     ICD-10-CM   1. Acute central serous retinopathy with subretinal fluid, right  H35.711 Focal Laser - OD - Right Eye      1.  Focal laser applied to the region inferonasal to FAZ.  Navigated, micro pulse, subthreshold OD  2.  3.  Ophthalmic Meds Ordered this visit:  No orders of the defined types were placed in this encounter.      Return in about 6 weeks (around 11/20/2020) for DILATE OU, COLOR FP, OPTOS FFA R/L, OCT.  There are no Patient Instructions on file for this visit.   Explained the diagnoses, plan, and follow up with the patient and they expressed understanding.  Patient expressed understanding of the importance of proper follow up care.   Alford Highland Bertran Zeimet M.D. Diseases & Surgery of the Retina and Vitreous [facility] 10/09/20  Abbreviations: M  myopia (nearsighted); A astigmatism; H hyperopia (farsighted); P presbyopia; Mrx spectacle prescription;  CTL contact lenses; OD right eye; OS left eye; OU both eyes  XT exotropia; ET esotropia; PEK punctate epithelial keratitis; PEE punctate epithelial erosions; DES dry eye syndrome; MGD meibomian gland dysfunction; ATs artificial tears; PFAT's preservative free artificial tears; NSC nuclear sclerotic cataract; PSC posterior subcapsular cataract; ERM epi-retinal membrane; PVD posterior vitreous detachment; RD retinal detachment; DM diabetes mellitus; DR diabetic retinopathy; NPDR non-proliferative diabetic retinopathy; PDR proliferative diabetic retinopathy; CSME clinically significant macular edema; DME diabetic macular edema; dbh dot blot hemorrhages; CWS cotton wool spot; POAG primary open angle glaucoma; C/D cup-to-disc ratio; HVF humphrey visual field; GVF goldmann visual field; OCT optical coherence tomography; IOP intraocular pressure; BRVO Branch retinal vein occlusion; CRVO central retinal vein occlusion; CRAO central retinal artery occlusion; BRAO branch retinal artery occlusion; RT retinal tear; SB scleral buckle; PPV pars plana vitrectomy; VH Vitreous hemorrhage; PRP panretinal laser photocoagulation; IVK intravitreal kenalog; VMT vitreomacular traction; MH Macular hole;  NVD neovascularization of the disc; NVE neovascularization elsewhere; AREDS age related eye disease study; ARMD age related macular degeneration; POAG primary open angle glaucoma; EBMD epithelial/anterior basement membrane dystrophy; ACIOL anterior chamber intraocular lens; IOL intraocular lens; PCIOL posterior chamber intraocular lens; Phaco/IOL phacoemulsification with intraocular lens placement; PRK photorefractive keratectomy; LASIK laser assisted in situ keratomileusis; HTN hypertension; DM diabetes mellitus; COPD chronic obstructive pulmonary disease

## 2020-11-08 ENCOUNTER — Encounter (HOSPITAL_COMMUNITY): Payer: Self-pay | Admitting: Radiology

## 2020-11-20 ENCOUNTER — Ambulatory Visit (INDEPENDENT_AMBULATORY_CARE_PROVIDER_SITE_OTHER): Payer: BC Managed Care – PPO | Admitting: Ophthalmology

## 2020-11-20 ENCOUNTER — Other Ambulatory Visit: Payer: Self-pay

## 2020-11-20 ENCOUNTER — Encounter (INDEPENDENT_AMBULATORY_CARE_PROVIDER_SITE_OTHER): Payer: Self-pay | Admitting: Ophthalmology

## 2020-11-20 DIAGNOSIS — H35711 Central serous chorioretinopathy, right eye: Secondary | ICD-10-CM | POA: Diagnosis not present

## 2020-11-20 MED ORDER — FLUORESCEIN SODIUM 10 % IV SOLN
500.0000 mg | INTRAVENOUS | Status: AC | PRN
  Administered 2020-11-20: 500 mg via INTRAVENOUS

## 2020-11-20 NOTE — Assessment & Plan Note (Signed)
5 to 6-week status post navigated micro pulse subthreshold Navilas laser, with improved acuity symptomatically and less thickening by OCT and slightly persistent but still smaller focal leakage inferior to FAZ OD.  We will look for further healing and the biologic effect of navigated laser delivery performed some 6 weeks previous

## 2020-11-20 NOTE — Progress Notes (Signed)
11/20/2020     CHIEF COMPLAINT Patient presents for  Chief Complaint  Patient presents with   Retina Follow Up      HISTORY OF PRESENT ILLNESS: Kyle Guzman is a 50 y.o. male who presents to the clinic today for:   HPI     Retina Follow Up   Patient presents with  Other.  In right eye.  This started 6 weeks ago.  Duration of 6 weeks.        Comments   6 week fp/ffa r/l oct. Pt states "a little better I think, but not clear still. My eyes are different, one is clear and one is blurred. When I close my right eye I see with my left eye without glasses, but my right eye I can't see anything. With my glasses on it is blurred right eye." Denies new FOL or floaters.      Last edited by Nelva Nay on 11/20/2020 10:56 AM.      Referring physician: Kaleen Mask, MD [address] [place],  [place] [address]  HISTORICAL INFORMATION:   Selected notes from the MEDICAL RECORD NUMBER       CURRENT MEDICATIONS: No current outpatient medications on file. (Ophthalmic Drugs)   No current facility-administered medications for this visit. (Ophthalmic Drugs)   Current Outpatient Medications (Other)  Medication Sig   azithromycin (ZITHROMAX) 250 MG tablet Take 1 tablet (250 mg total) by mouth daily. Take first 2 tablets together, then 1 every day until finished.   benzonatate (TESSALON) 100 MG capsule Take 1-2 capsules (100-200 mg total) by mouth 3 (three) times daily as needed.   cetirizine (ZYRTEC ALLERGY) 10 MG tablet Take 1 tablet (10 mg total) by mouth daily.   magic mouthwash (nystatin, lidocaine, diphenhydrAMINE, alum & mag hydroxide) suspension Swish and swallow 5 mLs 3 (three) times daily as needed for mouth pain.   mupirocin ointment (BACTROBAN) 2 % Apply 1 application topically 2 (two) times daily. To skin sores   potassium chloride SA (KLOR-CON) 20 MEQ tablet Take 2 tablets (40 mEq total) by mouth 2 (two) times daily.   promethazine-dextromethorphan  (PROMETHAZINE-DM) 6.25-15 MG/5ML syrup Take 5 mLs by mouth at bedtime as needed for cough.   pseudoephedrine (SUDAFED) 60 MG tablet Take 1 tablet (60 mg total) by mouth every 8 (eight) hours as needed for congestion.   No current facility-administered medications for this visit. (Other)      REVIEW OF SYSTEMS: ROS   Negative for: Constitutional, Gastrointestinal, Neurological, Skin, Genitourinary, Musculoskeletal, HENT, Endocrine, Cardiovascular, Eyes, Respiratory, Psychiatric, Allergic/Imm, Heme/Lymph Last edited by Edmon Crape, MD on 11/20/2020 12:00 PM.       ALLERGIES No Known Allergies  PAST MEDICAL HISTORY History reviewed. No pertinent past medical history. History reviewed. No pertinent surgical history.  FAMILY HISTORY Family History  Problem Relation Age of Onset   Healthy Mother     SOCIAL HISTORY Social History   Tobacco Use   Smoking status: Former   Smokeless tobacco: Former    Quit date: 10/27/2010  Vaping Use   Vaping Use: Never used  Substance Use Topics   Alcohol use: No   Drug use: No         OPHTHALMIC EXAM:  Base Eye Exam     Visual Acuity (ETDRS)       Right Left   Dist cc 20/25 -2+1 20/20    Correction: Glasses         Tonometry (Tonopen, 10:59 AM)  Right Left   Pressure 4 6         Pupils       Pupils Dark Light APD   Right PERRL 4 3 None   Left PERRL 4 3 None         Extraocular Movement       Right Left    Full Full         Neuro/Psych     Oriented x3: Yes   Mood/Affect: Normal         Dilation     Both eyes: 1.0% Mydriacyl, 2.5% Phenylephrine @ 10:59 AM           Slit Lamp and Fundus Exam     External Exam       Right Left   External Normal Normal         Slit Lamp Exam       Right Left   Lids/Lashes Normal Normal   Conjunctiva/Sclera White and quiet White and quiet   Cornea Clear Clear   Anterior Chamber Deep and quiet Deep and quiet   Iris Round and reactive Round  and reactive   Lens Clear Clear   Anterior Vitreous Normal Normal         Fundus Exam       Right Left   Posterior Vitreous Normal Normal   Disc Normal Normal   C/D Ratio 0.25 0.25   Macula Macular thickening, with serous retinal detachment in the fovea Normal   Vessels Normal Normal   Periphery Normal Normal            IMAGING AND PROCEDURES  Imaging and Procedures for 11/20/20  Fluorescein Angiography Optos (Transit OD)       Injection: 500 mg Fluorescein Sodium 10 %   Route: Intravenous   NDC: [phone]   Right Eye   Progression has no prior data. Early phase findings include window defect, leakage. Mid/Late phase findings include window defect, leakage. Choroidal neovascularization is not present.   Left Eye   Progression has no prior data. Mid/Late phase findings include normal observations. Choroidal neovascularization is not present.   Notes OD with focal CS CR leakage inferonasal to FAZ, much less diffuse much less leakage now some 5 weeks post subthreshold micro pulse navigated laser  Serous elevation classic for CS CR.  Overall improved  OD will observe     OCT, Retina - OU - Both Eyes       Right Eye Quality was good. Scan locations included subfoveal. Central Foveal Thickness: 368. Findings include abnormal foveal contour, subretinal fluid, vitreomacular adhesion .   Left Eye Central Foveal Thickness: 271. Progression has no prior data. Findings include normal foveal contour, vitreomacular adhesion .   Notes Serous subfoveal detachment classic CSCR, overall less thickening we will continue to monitor and observe     Color Fundus Photography Optos - OU - Both Eyes       Right Eye Progression has no prior data. Disc findings include normal observations. Vessels : normal observations. Periphery : normal observations.   Left Eye Progression has no prior data. Disc findings include normal observations. Macula : normal observations.  Vessels : normal observations. Periphery : normal observations.   Notes Serous elevation of the fovea, slightly less             ASSESSMENT/PLAN:  Acute central serous retinopathy with subretinal fluid, right 5 to 6-week status post navigated micro pulse subthreshold Navilas laser, with improved acuity symptomatically and  less thickening by OCT and slightly persistent but still smaller focal leakage inferior to FAZ OD.  We will look for further healing and the biologic effect of navigated laser delivery performed some 6 weeks previous      ICD-10-CM   1. Acute central serous retinopathy with subretinal fluid, right  H35.711 Fluorescein Angiography Optos (Transit OD)    OCT, Retina - OU - Both Eyes    Color Fundus Photography Optos - OU - Both Eyes    Fluorescein Sodium 10 % injection 500 mg      1.  Improved CS CR OD 6 weeks post navigated micro pulse laser will continue to observe  2.  3.  Ophthalmic Meds Ordered this visit:  Meds ordered this encounter  Medications   Fluorescein Sodium 10 % injection 500 mg       Return in about 8 weeks (around 01/15/2021) for dilate, OD, OCT.  There are no Patient Instructions on file for this visit.   Explained the diagnoses, plan, and follow up with the patient and they expressed understanding.  Patient expressed understanding of the importance of proper follow up care.   Alford Highland Jennice Renegar M.D. Diseases & Surgery of the Retina and Vitreous [facility] 11/20/20     Abbreviations: M myopia (nearsighted); A astigmatism; H hyperopia (farsighted); P presbyopia; Mrx spectacle prescription;  CTL contact lenses; OD right eye; OS left eye; OU both eyes  XT exotropia; ET esotropia; PEK punctate epithelial keratitis; PEE punctate epithelial erosions; DES dry eye syndrome; MGD meibomian gland dysfunction; ATs artificial tears; PFAT's preservative free artificial tears; NSC nuclear sclerotic cataract; PSC posterior  subcapsular cataract; ERM epi-retinal membrane; PVD posterior vitreous detachment; RD retinal detachment; DM diabetes mellitus; DR diabetic retinopathy; NPDR non-proliferative diabetic retinopathy; PDR proliferative diabetic retinopathy; CSME clinically significant macular edema; DME diabetic macular edema; dbh dot blot hemorrhages; CWS cotton wool spot; POAG primary open angle glaucoma; C/D cup-to-disc ratio; HVF humphrey visual field; GVF goldmann visual field; OCT optical coherence tomography; IOP intraocular pressure; BRVO Branch retinal vein occlusion; CRVO central retinal vein occlusion; CRAO central retinal artery occlusion; BRAO branch retinal artery occlusion; RT retinal tear; SB scleral buckle; PPV pars plana vitrectomy; VH Vitreous hemorrhage; PRP panretinal laser photocoagulation; IVK intravitreal kenalog; VMT vitreomacular traction; MH Macular hole;  NVD neovascularization of the disc; NVE neovascularization elsewhere; AREDS age related eye disease study; ARMD age related macular degeneration; POAG primary open angle glaucoma; EBMD epithelial/anterior basement membrane dystrophy; ACIOL anterior chamber intraocular lens; IOL intraocular lens; PCIOL posterior chamber intraocular lens; Phaco/IOL phacoemulsification with intraocular lens placement; PRK photorefractive keratectomy; LASIK laser assisted in situ keratomileusis; HTN hypertension; DM diabetes mellitus; COPD chronic obstructive pulmonary disease

## 2020-11-24 DIAGNOSIS — K219 Gastro-esophageal reflux disease without esophagitis: Secondary | ICD-10-CM | POA: Insufficient documentation

## 2020-11-24 DIAGNOSIS — R490 Dysphonia: Secondary | ICD-10-CM | POA: Insufficient documentation

## 2021-01-06 ENCOUNTER — Ambulatory Visit (INDEPENDENT_AMBULATORY_CARE_PROVIDER_SITE_OTHER): Payer: BC Managed Care – PPO

## 2021-01-06 ENCOUNTER — Other Ambulatory Visit: Payer: Self-pay

## 2021-01-06 ENCOUNTER — Ambulatory Visit
Admission: EM | Admit: 2021-01-06 | Discharge: 2021-01-06 | Disposition: A | Discharge status: Home / Self Care | Payer: BC Managed Care – PPO

## 2021-01-06 DIAGNOSIS — R0602 Shortness of breath: Secondary | ICD-10-CM

## 2021-01-06 DIAGNOSIS — R059 Cough, unspecified: Secondary | ICD-10-CM | POA: Diagnosis not present

## 2021-01-06 DIAGNOSIS — J209 Acute bronchitis, unspecified: Secondary | ICD-10-CM | POA: Diagnosis not present

## 2021-01-06 DIAGNOSIS — R079 Chest pain, unspecified: Secondary | ICD-10-CM | POA: Diagnosis not present

## 2021-01-06 HISTORY — DX: Pneumonia, unspecified organism: J18.9

## 2021-01-06 MED ORDER — PREDNISONE 20 MG PO TABS: 40.0000 mg | ORAL_TABLET | Freq: Every day | ORAL | 0 refills | Status: AC

## 2021-01-06 NOTE — ED Provider Notes (Signed)
[facility]    CSN: [id number] Arrival date & time: 01/06/21  0807      History   Chief Complaint Chief Complaint  Patient presents with   Shortness of Breath    HPI Kyle Guzman is a 50 y.o. male.   Patient here today for evaluation of substernal chest pain that started about a week ago.  He reports that taking deep breaths seems to worsen pain.  He denies pain with activity.  He does report that sometimes he will feel tightness in his chest.  He has not had any known wheezing.  He does report that he has had pneumonia in the past and would like to rule out this as a cause of his symptoms.  He has not had fever.  He does not report any treatment for symptoms.  The history is provided by the patient.  Shortness of Breath Associated symptoms: cough   Associated symptoms: no abdominal pain, no ear pain, no fever, no sore throat and no vomiting    Past Medical History:  Diagnosis Date   PNA (pneumonia)     Patient Active Problem List   Diagnosis Date Noted   Acute central serous retinopathy with subretinal fluid, right 10/01/2020    History reviewed. No pertinent surgical history.     Home Medications    Prior to Admission medications   Medication Sig Start Date End Date Taking? Authorizing Provider  predniSONE (DELTASONE) 20 MG tablet Take 2 tablets (40 mg total) by mouth daily with breakfast for 5 days. 01/06/21 01/11/21 Yes Tomi Bamberger, PA-C  azithromycin (ZITHROMAX) 250 MG tablet Take 1 tablet (250 mg total) by mouth daily. Take first 2 tablets together, then 1 every day until finished. 09/26/20   Theron Arista, PA-C  benzonatate (TESSALON) 100 MG capsule Take 1-2 capsules (100-200 mg total) by mouth 3 (three) times daily as needed. 09/30/20   Wallis Bamberg, PA-C  cetirizine (ZYRTEC ALLERGY) 10 MG tablet Take 1 tablet (10 mg total) by mouth daily. 09/08/20   Wallis Bamberg, PA-C  magic mouthwash (nystatin, lidocaine, diphenhydrAMINE, alum & mag hydroxide)  suspension Swish and swallow 5 mLs 3 (three) times daily as needed for mouth pain. 04/07/20   Wieters, Hallie C, PA-C  mupirocin ointment (BACTROBAN) 2 % Apply 1 application topically 2 (two) times daily. To skin sores 04/07/20   Wieters, Hallie C, PA-C  mycophenolate (CELLCEPT) 500 MG tablet Take 1 tablet by mouth daily. 01/02/21   [provider]  potassium chloride SA (KLOR-CON) 20 MEQ tablet Take 2 tablets (40 mEq total) by mouth 2 (two) times daily. 09/26/20   Theron Arista, PA-C  promethazine-dextromethorphan (PROMETHAZINE-DM) 6.25-15 MG/5ML syrup Take 5 mLs by mouth at bedtime as needed for cough. 09/30/20   Wallis Bamberg, PA-C  pseudoephedrine (SUDAFED) 60 MG tablet Take 1 tablet (60 mg total) by mouth every 8 (eight) hours as needed for congestion. 09/08/20   Wallis Bamberg, PA-C    Family History Family History  Problem Relation Age of Onset   Healthy Mother     Social History Social History   Tobacco Use   Smoking status: Former   Smokeless tobacco: Former    Quit date: 10/27/2010  Vaping Use   Vaping Use: Never used  Substance Use Topics   Alcohol use: No   Drug use: No     Allergies   Patient has no known allergies.   Review of Systems Review of Systems  Constitutional:  Negative for chills and fever.  HENT:  Negative for congestion, ear pain and sore throat.   Eyes:  Negative for discharge and redness.  Respiratory:  Positive for cough and shortness of breath.   Gastrointestinal:  Negative for abdominal pain, nausea and vomiting.    Physical Exam Triage Vital Signs ED Triage Vitals  Enc Vitals Group     BP 01/06/21 0831 133/81     Pulse Rate 01/06/21 0831 72     Resp 01/06/21 0831 18     Temp 01/06/21 0831 98.3 F (36.8 C)     Temp Source 01/06/21 0831 Oral     SpO2 01/06/21 0831 98 %     Weight --      Height --      Head Circumference --      Peak Flow --      Pain Score 01/06/21 0832 0     Pain Loc --      Pain Edu? --      Excl. in GC? --     No data found.  Updated Vital Signs BP 133/81 (BP Location: Right Arm)    Pulse 72    Temp 98.3 F (36.8 C) (Oral)    Resp 18    SpO2 98%   Physical Exam Vitals and nursing note reviewed.  Constitutional:      General: He is not in acute distress.    Appearance: Normal appearance. He is not ill-appearing.  HENT:     Head: Normocephalic and atraumatic.     Right Ear: Tympanic membrane normal.     Left Ear: Tympanic membrane normal.     Nose: Nose normal. No congestion.     Mouth/Throat:     Mouth: Mucous membranes are moist.     Pharynx: Oropharynx is clear. No oropharyngeal exudate or posterior oropharyngeal erythema.  Eyes:     Conjunctiva/sclera: Conjunctivae normal.  Cardiovascular:     Rate and Rhythm: Normal rate and regular rhythm.     Heart sounds: Normal heart sounds. No murmur heard. Pulmonary:     Effort: Pulmonary effort is normal. No respiratory distress.     Breath sounds: Normal breath sounds. No wheezing, rhonchi or rales.  Skin:    General: Skin is warm and dry.  Neurological:     Mental Status: He is alert.  Psychiatric:        Mood and Affect: Mood normal.        Thought Content: Thought content normal.     UC Treatments / Results  Labs (all labs ordered are listed, but only abnormal results are displayed) Labs Reviewed - No data to display  EKG   Radiology DG Chest 2 View  Result Date: 01/06/2021 CLINICAL DATA:  Cough, chest pain and shortness of breath. EXAM: CHEST - 2 VIEW COMPARISON:  09/26/2020 FINDINGS: Heart size is normal. Mediastinal shadows are normal. There may be central bronchial thickening but there is no infiltrate, collapse or effusion. No significant bone finding. IMPRESSION: Possible bronchitis.  No consolidation or collapse. Electronically Signed   By: Paulina Fusi M.D.   On: 01/06/2021 08:53    Procedures Procedures (including critical care time)  Medications Ordered in UC Medications - No data to display  Initial  Impression / Assessment and Plan / UC Course  I have reviewed the triage vital signs and the nursing notes.  Pertinent labs & imaging results that were available during my care of the patient were reviewed by me and considered in my medical decision making (see  chart for details).    Very low suspicion for cardiac etiology given description of symptoms, stable vitals, and duration of symptoms.  Suspect most likely bronchitis given appearance of x-ray, and will treat with steroid burst for same.  Did recommend that he report to the ED with any worsening chest pain or tightness for cardiac labs and rule out further.  Patient expresses understanding.  Final Clinical Impressions(s) / UC Diagnoses   Final diagnoses:  Acute bronchitis, unspecified organism     Discharge Instructions      Please report to ED with any worsening symptoms.     ED Prescriptions     Medication Sig Dispense Auth. Provider   predniSONE (DELTASONE) 20 MG tablet Take 2 tablets (40 mg total) by mouth daily with breakfast for 5 days. 10 tablet Tomi Bamberger, PA-C      PDMP not reviewed this encounter.   Tomi Bamberger, PA-C 01/06/21 [phone]

## 2021-01-06 NOTE — ED Triage Notes (Signed)
Pt c/o chest pain, SOB, hurts to breathe deeply, states "I have some mucous" and points to throat, cough.  Denies sore throat, nasal congestion, headache, nausea, vomiting, diarrhea, constipation, body aches or chills.   Onset x 1 week

## 2021-01-06 NOTE — Discharge Instructions (Signed)
  Please report to ED with any worsening symptoms. 

## 2021-01-15 ENCOUNTER — Ambulatory Visit (INDEPENDENT_AMBULATORY_CARE_PROVIDER_SITE_OTHER): Payer: BC Managed Care – PPO | Admitting: Ophthalmology

## 2021-01-15 ENCOUNTER — Other Ambulatory Visit: Payer: Self-pay

## 2021-01-15 ENCOUNTER — Encounter (INDEPENDENT_AMBULATORY_CARE_PROVIDER_SITE_OTHER): Payer: Self-pay | Admitting: Ophthalmology

## 2021-01-15 DIAGNOSIS — H35711 Central serous chorioretinopathy, right eye: Secondary | ICD-10-CM

## 2021-01-15 NOTE — Patient Instructions (Signed)
Patient to instruct his dermatologist that all attempts to use steroid sparing agents to treat his skin condition should be undertaken since steroids can make his eye condition in the right eye worse and he could develop it in the fellow eye as well, central serous retinopathy

## 2021-01-15 NOTE — Progress Notes (Signed)
01/15/2021     CHIEF COMPLAINT Patient presents for  Chief Complaint  Patient presents with   Retina Evaluation      HISTORY OF PRESENT ILLNESS: Kyle Guzman is a 50 y.o. male who presents to the clinic today for:    HPI     Retina Evaluation           Laterality: right eye   MD Performed: performed the HPI with the patient and updated documentation appropriately         Comments   OD, follow-up for central serous retinopathy now some 3 months post focal laser navigated.  Patient does have a skin condition for which he is on oral prednisone therapy per his report.      Last edited by Edmon Crape, MD on 01/15/2021 11:01 AM.      Referring physician: Kaleen Mask, MD [address] [place],  [place] [address]  HISTORICAL INFORMATION:   Selected notes from the MEDICAL RECORD NUMBER       CURRENT MEDICATIONS: No current outpatient medications on file. (Ophthalmic Drugs)   No current facility-administered medications for this visit. (Ophthalmic Drugs)   Current Outpatient Medications (Other)  Medication Sig   azithromycin (ZITHROMAX) 250 MG tablet Take 1 tablet (250 mg total) by mouth daily. Take first 2 tablets together, then 1 every day until finished.   benzonatate (TESSALON) 100 MG capsule Take 1-2 capsules (100-200 mg total) by mouth 3 (three) times daily as needed.   cetirizine (ZYRTEC ALLERGY) 10 MG tablet Take 1 tablet (10 mg total) by mouth daily.   magic mouthwash (nystatin, lidocaine, diphenhydrAMINE, alum & mag hydroxide) suspension Swish and swallow 5 mLs 3 (three) times daily as needed for mouth pain.   mupirocin ointment (BACTROBAN) 2 % Apply 1 application topically 2 (two) times daily. To skin sores   mycophenolate (CELLCEPT) 500 MG tablet Take 1 tablet by mouth daily.   potassium chloride SA (KLOR-CON) 20 MEQ tablet Take 2 tablets (40 mEq total) by mouth 2 (two) times daily.   promethazine-dextromethorphan (PROMETHAZINE-DM) 6.25-15  MG/5ML syrup Take 5 mLs by mouth at bedtime as needed for cough.   pseudoephedrine (SUDAFED) 60 MG tablet Take 1 tablet (60 mg total) by mouth every 8 (eight) hours as needed for congestion.   No current facility-administered medications for this visit. (Other)      REVIEW OF SYSTEMS:    ALLERGIES No Known Allergies  PAST MEDICAL HISTORY Past Medical History:  Diagnosis Date   PNA (pneumonia)    No past surgical history on file.  FAMILY HISTORY Family History  Problem Relation Age of Onset   Healthy Mother     SOCIAL HISTORY Social History   Tobacco Use   Smoking status: Former   Smokeless tobacco: Former    Quit date: 10/27/2010  Vaping Use   Vaping Use: Never used  Substance Use Topics   Alcohol use: No   Drug use: No         OPHTHALMIC EXAM:  Not recorded     IMAGING AND PROCEDURES  Imaging and Procedures for 01/15/21  OCT, Retina - OU - Both Eyes       Right Eye Quality was good. Scan locations included subfoveal. Central Foveal Thickness: 530. Findings include abnormal foveal contour, subretinal fluid, vitreomacular adhesion .   Left Eye Central Foveal Thickness: 271. Progression has no prior data. Findings include normal foveal contour, vitreomacular adhesion .   Notes Serous subfoveal detachment classic CSCR,  slight increase again of subretinal fluid in the macular region OD.  Some 3 months post focal laser             ASSESSMENT/PLAN:  Acute central serous retinopathy with subretinal fluid, right 3 months post focal laser right eye for central serous retinopathy with recurrence of subretinal fluid.  We will follow-up schedule in 6 weeks with FFA, OD OS and likely navigated laser again to any new leakages that may be discovered and/or slightly and more intense laser treatment to the previously performed   Patient now reports he is on high-dose oral prednisone for his skin condition in combination apparently with CellCept.  This  medication is being delivered from his dermatologist Dr. Coralie Carpen  I will explained to the patient that oral prednisone makes this condition much worse.  If there is any way that he could use steroid sparing therapy that would help prevent recurrences of central serous retinopathy in his case.     ICD-10-CM   1. Acute central serous retinopathy with subretinal fluid, right  H35.711       1.  OD, central serous retinopathy much worse now concomitant with systemic use of oral prednisone for a skin condition.  2.  I explained the patient there is no need to treat treat at this time and the treatment should consist of attempting to avoid systemic steroid therapy except for life-threatening conditions.  3.  Patient to see his dermatologist this afternoon and this should be discussed with the this doctor  Ophthalmic Meds Ordered this visit:  No orders of the defined types were placed in this encounter.      Return in about 6 weeks (around 02/26/2021) for DILATE OU, COLOR FP, OPTOS FFA R/L, OCT, possible focal LASER, OD.  Patient Instructions  Patient to instruct his dermatologist that all attempts to use steroid sparing agents to treat his skin condition should be undertaken since steroids can make his eye condition in the right eye worse and he could develop it in the fellow eye as well, central serous retinopathy   Explained the diagnoses, plan, and follow up with the patient and they expressed understanding.  Patient expressed understanding of the importance of proper follow up care.   Alford Highland Emsley Custer M.D. Diseases & Surgery of the Retina and Vitreous [facility] 01/15/21     Abbreviations: M myopia (nearsighted); A astigmatism; H hyperopia (farsighted); P presbyopia; Mrx spectacle prescription;  CTL contact lenses; OD right eye; OS left eye; OU both eyes  XT exotropia; ET esotropia; PEK punctate epithelial keratitis; PEE punctate epithelial erosions; DES dry eye  syndrome; MGD meibomian gland dysfunction; ATs artificial tears; PFAT's preservative free artificial tears; NSC nuclear sclerotic cataract; PSC posterior subcapsular cataract; ERM epi-retinal membrane; PVD posterior vitreous detachment; RD retinal detachment; DM diabetes mellitus; DR diabetic retinopathy; NPDR non-proliferative diabetic retinopathy; PDR proliferative diabetic retinopathy; CSME clinically significant macular edema; DME diabetic macular edema; dbh dot blot hemorrhages; CWS cotton wool spot; POAG primary open angle glaucoma; C/D cup-to-disc ratio; HVF humphrey visual field; GVF goldmann visual field; OCT optical coherence tomography; IOP intraocular pressure; BRVO Branch retinal vein occlusion; CRVO central retinal vein occlusion; CRAO central retinal artery occlusion; BRAO branch retinal artery occlusion; RT retinal tear; SB scleral buckle; PPV pars plana vitrectomy; VH Vitreous hemorrhage; PRP panretinal laser photocoagulation; IVK intravitreal kenalog; VMT vitreomacular traction; MH Macular hole;  NVD neovascularization of the disc; NVE neovascularization elsewhere; AREDS age related eye disease study; ARMD age related macular  degeneration; POAG primary open angle glaucoma; EBMD epithelial/anterior basement membrane dystrophy; ACIOL anterior chamber intraocular lens; IOL intraocular lens; PCIOL posterior chamber intraocular lens; Phaco/IOL phacoemulsification with intraocular lens placement; [place] photorefractive keratectomy; LASIK laser assisted in situ keratomileusis; HTN hypertension; DM diabetes mellitus; COPD chronic obstructive pulmonary disease

## 2021-01-15 NOTE — Assessment & Plan Note (Addendum)
3 months post focal laser right eye for central serous retinopathy with recurrence of subretinal fluid.  We will follow-up schedule in 6 weeks with FFA, OD OS and likely navigated laser again to any new leakages that may be discovered and/or slightly and more intense laser treatment to the previously performed   Patient now reports he is on high-dose oral prednisone for his skin condition in combination apparently with CellCept.  This medication is being delivered from his dermatologist Dr. Coralie Carpen  I will explained to the patient that oral prednisone makes this condition much worse.  If there is any way that he could use steroid sparing therapy that would help prevent recurrences of central serous retinopathy in his case.

## 2021-01-16 ENCOUNTER — Other Ambulatory Visit: Payer: Self-pay | Admitting: Family Medicine

## 2021-01-16 DIAGNOSIS — R1011 Right upper quadrant pain: Secondary | ICD-10-CM

## 2021-01-22 ENCOUNTER — Ambulatory Visit
Admission: RE | Admit: 2021-01-22 | Discharge: 2021-01-22 | Disposition: A | Discharge status: Home / Self Care | Payer: BC Managed Care – PPO | Source: Ambulatory Visit | Attending: Family Medicine | Admitting: Family Medicine

## 2021-01-22 DIAGNOSIS — R1011 Right upper quadrant pain: Secondary | ICD-10-CM

## 2021-02-25 ENCOUNTER — Ambulatory Visit
Admission: RE | Admit: 2021-02-25 | Discharge: 2021-02-25 | Disposition: A | Discharge status: Home / Self Care | Payer: No Typology Code available for payment source | Source: Ambulatory Visit | Attending: Family Medicine | Admitting: Family Medicine

## 2021-02-25 ENCOUNTER — Encounter (INDEPENDENT_AMBULATORY_CARE_PROVIDER_SITE_OTHER): Payer: No Typology Code available for payment source | Admitting: Ophthalmology

## 2021-02-25 ENCOUNTER — Encounter (INDEPENDENT_AMBULATORY_CARE_PROVIDER_SITE_OTHER): Payer: Self-pay | Admitting: Ophthalmology

## 2021-02-25 ENCOUNTER — Ambulatory Visit (INDEPENDENT_AMBULATORY_CARE_PROVIDER_SITE_OTHER): Payer: No Typology Code available for payment source | Admitting: Ophthalmology

## 2021-02-25 ENCOUNTER — Other Ambulatory Visit: Payer: Self-pay

## 2021-02-25 DIAGNOSIS — H35711 Central serous chorioretinopathy, right eye: Secondary | ICD-10-CM | POA: Diagnosis not present

## 2021-02-25 DIAGNOSIS — M810 Age-related osteoporosis without current pathological fracture: Secondary | ICD-10-CM

## 2021-02-25 MED ORDER — FLUORESCEIN SODIUM 10 % IV SOLN
500.0000 mg | INTRAVENOUS | Status: AC | PRN
  Administered 2021-02-25: 500 mg via INTRAVENOUS

## 2021-02-25 NOTE — Assessment & Plan Note (Signed)
OD, 4 months post fluorescein angiography guided micro pulse subthreshold yellow laser to a region inferonasal to the center of [place].  Overall stable and slightly improved and less active leakage today on follow-up fluorescein angiogram right eye.  Could also be coincident with a recent decline in systemic prednisone usage.  Follow-up next in 8 weeks and if no decrease in serous subretinal fluid, will add additional laser to the same hotspot seen on CS CR OD.

## 2021-02-25 NOTE — Progress Notes (Signed)
02/25/2021     CHIEF COMPLAINT Patient presents for  Chief Complaint  Patient presents with   Retina Follow Up      HISTORY OF PRESENT ILLNESS: Kyle Guzman is a 51 y.o. male who presents to the clinic today for:   HPI     Retina Follow Up           Diagnosis: Other   Laterality: right eye   Onset: 6 weeks ago   Severity: mild   Duration: 6 weeks   Course: stable         Comments   6 week fu OU and ffa r/l oct and fp  Pt states VA OU stable since last visit. Pt denies FOL, floaters, or ocular pain OU.  Pt reports being on 12mg  PO Prednisone        Last edited by Edmon Crapeankin, Odilia Damico A, MD on 02/25/2021 11:23 AM.      Referring physician: Kaleen MaskElkins, Wilson Oliver, MD [address] [place],  [place] [id number]  HISTORICAL INFORMATION:   Selected notes from the MEDICAL RECORD NUMBER       CURRENT MEDICATIONS: No current outpatient medications on file. (Ophthalmic Drugs)   No current facility-administered medications for this visit. (Ophthalmic Drugs)   Current Outpatient Medications (Other)  Medication Sig   azithromycin (ZITHROMAX) 250 MG tablet Take 1 tablet (250 mg total) by mouth daily. Take first 2 tablets together, then 1 every day until finished.   benzonatate (TESSALON) 100 MG capsule Take 1-2 capsules (100-200 mg total) by mouth 3 (three) times daily as needed.   cetirizine (ZYRTEC ALLERGY) 10 MG tablet Take 1 tablet (10 mg total) by mouth daily.   magic mouthwash (nystatin, lidocaine, diphenhydrAMINE, alum & mag hydroxide) suspension Swish and swallow 5 mLs 3 (three) times daily as needed for mouth pain.   mupirocin ointment (BACTROBAN) 2 % Apply 1 application topically 2 (two) times daily. To skin sores   mycophenolate (CELLCEPT) 500 MG tablet Take 1 tablet by mouth daily.   potassium chloride SA (KLOR-CON) 20 MEQ tablet Take 2 tablets (40 mEq total) by mouth 2 (two) times daily.   promethazine-dextromethorphan (PROMETHAZINE-DM) 6.25-15 MG/5ML syrup Take 5  mLs by mouth at bedtime as needed for cough.   pseudoephedrine (SUDAFED) 60 MG tablet Take 1 tablet (60 mg total) by mouth every 8 (eight) hours as needed for congestion.   No current facility-administered medications for this visit. (Other)      REVIEW OF SYSTEMS: ROS   Negative for: Constitutional, Gastrointestinal, Neurological, Skin, Genitourinary, Musculoskeletal, HENT, Endocrine, Cardiovascular, Eyes, Respiratory, Psychiatric, Allergic/Imm, Heme/Lymph Last edited by Edmon Crapeankin, Raynesha Tiedt A, MD on 02/25/2021 11:26 AM.       ALLERGIES No Known Allergies  PAST MEDICAL HISTORY Past Medical History:  Diagnosis Date   PNA (pneumonia)    History reviewed. No pertinent surgical history.  FAMILY HISTORY Family History  Problem Relation Age of Onset   Healthy Mother     SOCIAL HISTORY Social History   Tobacco Use   Smoking status: Former   Smokeless tobacco: Former    Quit date: 10/27/2010  Vaping Use   Vaping Use: Never used  Substance Use Topics   Alcohol use: No   Drug use: No         OPHTHALMIC EXAM:  Base Eye Exam     Visual Acuity (ETDRS)       Right Left   Dist cc 20/60 20/30   Dist ph cc 20/25 20/25 -1  Correction: Glasses         Tonometry (Tonopen, 10:42 AM)       Right Left   Pressure 17 16         Pupils       Pupils Shape React APD   Right PERRL Round Brisk None   Left PERRL Round Brisk None         Visual Fields (Counting fingers)       Left Right    Full Full         Extraocular Movement       Right Left    Full, Ortho Full, Ortho         Neuro/Psych     Oriented x3: Yes   Mood/Affect: Normal         Dilation     Both eyes: 1.0% Mydriacyl, 2.5% Phenylephrine @ 10:41 AM           Slit Lamp and Fundus Exam     External Exam       Right Left   External Normal Normal         Slit Lamp Exam       Right Left   Lids/Lashes Normal Normal   Conjunctiva/Sclera White and quiet White and quiet    Cornea Clear Clear   Anterior Chamber Deep and quiet Deep and quiet   Iris Round and reactive Round and reactive   Lens Clear Clear   Anterior Vitreous Normal Normal         Fundus Exam       Right Left   Posterior Vitreous Normal Normal   Disc Normal Normal   C/D Ratio 0.25 0.25   Macula Macular thickening, with serous retinal detachment in the fovea Normal   Vessels Normal Normal   Periphery Normal Normal            IMAGING AND PROCEDURES  Imaging and Procedures for 02/25/21  OCT, Retina - OU - Both Eyes       Right Eye Quality was good. Scan locations included subfoveal. Central Foveal Thickness: 441. Findings include abnormal foveal contour, subretinal fluid, vitreomacular adhesion .   Left Eye Central Foveal Thickness: 274. Progression has no prior data. Findings include normal foveal contour, vitreomacular adhesion .   Notes Serous subfoveal detachment classic CSCR, significant decrease of subretinal fluid in the macular region OD.  Coincident with decline in prednisone use from 60 mg and 12 mg daily     Color Fundus Photography Optos - OU - Both Eyes       Right Eye Progression has no prior data. Disc findings include normal observations. Vessels : normal observations. Periphery : normal observations.   Left Eye Progression has no prior data. Disc findings include normal observations. Macula : normal observations. Vessels : normal observations. Periphery : normal observations.   Notes Serous elevation of the fovea, slightly less     Fluorescein Angiography Optos (Transit OD)       Injection: 500 mg Fluorescein Sodium 10 %   Route: Intravenous   NDC: [phone]   Right Eye   Progression has no prior data. Early phase findings include window defect, leakage. Mid/Late phase findings include window defect, leakage. Choroidal neovascularization is not present.   Left Eye   Progression has no prior data. Mid/Late phase findings include normal  observations. Choroidal neovascularization is not present.   Notes OD with focal CS CR leakage inferonasal to FAZ, much less diffuse much less leakage now  some 4 months post subthreshold micro pulse navigated laser  Serous elevation classic for CS CR.  Overall improved  OD will observe     DG BONE DENSITY (DXA)       EXAM: DUAL X-RAY ABSORPTIOMETRY (DXA) FOR BONE MINERAL DENSITY  IMPRESSION: Referring Physician:  Kaleen Mask Your patient completed a bone mineral density test using GE Lunar iDXA system (analysis version: 16). Technologist: EH PATIENT: Name: Tamika, Mohn Patient ID: [id number] Birth Date: Oct 03, 1970 Height: 66.0 in. Sex: Male Measured: 02/25/2021 Weight: 148.0 lbs. Indications: Cellcept, High risk medication use, Methylprednisonlone, Secondary Osteoporosis Fractures: NONE Treatments: Vitamin D (E933.5)  ASSESSMENT: The BMD measured at Femur Neck Right is 0.771 g/cm2 with a T-score of -1.9. This patient is considered osteopenic/low bone mass according to World Health Organization [facility]) criteria.  The quality of the exam is good.  Site Region Measured Date Measured Age YA BMD Significant CHANGE T-score DualFemur Neck Right 02/25/2021    50.5         -1.9    0.771 g/cm2  AP Spine  L1-L4      02/25/2021    50.5         -1.0    1.065 g/cm2  DualFemur Total Mean 02/25/2021    50.5         -1.0    0.876 g/cm2  World Health Organization [facility]) criteria for post-menopausal, Caucasian Women: Normal       T-score at or above -1 SD Osteopenia   T-score between -1 and -2.5 SD Osteoporosis T-score at or below -2.5 SD  RECOMMENDATION: National Osteoporosis Foundation recommends that FDA-approved medical therapies be considered in postmenopausal women and men age 61 or older with a: 1. Hip or vertebral (clinical or morphometric) fracture. 2. T-score of less than or equal to -2.5 at the spine or hip. 3. Ten-year fracture probability by FRAX of 3% or greater  for hip fracture or 20% or greater for major osteoporotic fracture.  All treatment decisions require clinical judgment and consideration of individual patient factors, including patient preferences, co-morbidities, previous drug use, risk factors not captured in the FRAX model (e.g. falls, vitamin D deficiency, increased bone turnover, interval significant decline in bone density) and possible under- or over-estimation of fracture risk by FRAX.  All patients should ensure an adequate intake of dietary calcium (1200 mg/d) and vitamin D (800 IU daily) unless contraindicated.  FOLLOW-UP: People with diagnosed cases of osteoporosis or osteopenia should be regularly tested for bone mineral density. For patients eligible for Medicare, routine testing is allowed once every 2 years. The testing frequency can be increased to one year for patients who have rapidly progressing disease, or for those who are receiving medical therapy to restore bone mass.  I have reviewed this study and agree with the findings. [facility]  FRAX* 10-year Probability of Fracture Based on femoral neck BMD: DualFemur (Right)  Major Osteoporotic Fracture: 2.8% Hip Fracture:                0.6% Population:                  [place] (Asian) Risk Factors:                Secondary Osteoporosis  *FRAX is a Armed forces logistics/support/administrative officer of the [employer] of [employer] for Metabolic Bone Disease, a World Science writer (WHO) [employer]. ASSESSMENT: The probability of a major osteoporotic fracture is 2.8% within the next ten years.  The  probability of a hip fracture is 0.6% within the next ten years.   Electronically Signed   By: Ernie Avena M.D.   On: 02/25/2021 08:32              ASSESSMENT/PLAN:  Acute central serous retinopathy with subretinal fluid, right OD, 4 months post fluorescein angiography guided micro pulse subthreshold yellow laser to a region  inferonasal to the center of FAZ.  Overall stable and slightly improved and less active leakage today on follow-up fluorescein angiogram right eye.  Could also be coincident with a recent decline in systemic prednisone usage.  Follow-up next in 8 weeks and if no decrease in serous subretinal fluid, will add additional laser to the same hotspot seen on CS CR OD.       ICD-10-CM   1. Acute central serous retinopathy with subretinal fluid, right  H35.711 OCT, Retina - OU - Both Eyes    Color Fundus Photography Optos - OU - Both Eyes    Fluorescein Angiography Optos (Transit OD)    Fluorescein Sodium 10 % injection 500 mg      1.  OD much improved by OCT and clinically.  May be coincident however with the decline in systemic steroid use.  Focal ablation successfully carried out in a micro pulse subthreshold fashion some 4 months previous, with overall an improvement in the left amount of leakage however still with discernible leakage  2.  Follow-up OD in 8 weeks with OCT if persistent leakage, will consider repeat focal subthreshold micro pulse yellow laser  3.  Ophthalmic Meds Ordered this visit:  Meds ordered this encounter  Medications   Fluorescein Sodium 10 % injection 500 mg       Return in about 8 weeks (around 04/22/2021) for dilate, OD, OCT.  There are no Patient Instructions on file for this visit.   Explained the diagnoses, plan, and follow up with the patient and they expressed understanding.  Patient expressed understanding of the importance of proper follow up care.   Alford Highland Iceis Knab M.D. Diseases & Surgery of the Retina and Vitreous [facility] 02/25/21     Abbreviations: M myopia (nearsighted); A astigmatism; H hyperopia (farsighted); P presbyopia; Mrx spectacle prescription;  CTL contact lenses; OD right eye; OS left eye; OU both eyes  XT exotropia; ET esotropia; PEK punctate epithelial keratitis; PEE punctate epithelial erosions; DES dry eye  syndrome; MGD meibomian gland dysfunction; ATs artificial tears; PFAT's preservative free artificial tears; NSC nuclear sclerotic cataract; PSC posterior subcapsular cataract; ERM epi-retinal membrane; PVD posterior vitreous detachment; RD retinal detachment; DM diabetes mellitus; DR diabetic retinopathy; NPDR non-proliferative diabetic retinopathy; PDR proliferative diabetic retinopathy; CSME clinically significant macular edema; DME diabetic macular edema; dbh dot blot hemorrhages; CWS cotton wool spot; POAG primary open angle glaucoma; C/D cup-to-disc ratio; HVF humphrey visual field; GVF goldmann visual field; OCT optical coherence tomography; IOP intraocular pressure; BRVO Branch retinal vein occlusion; CRVO central retinal vein occlusion; CRAO central retinal artery occlusion; BRAO branch retinal artery occlusion; RT retinal tear; SB scleral buckle; PPV pars plana vitrectomy; VH Vitreous hemorrhage; PRP panretinal laser photocoagulation; IVK intravitreal kenalog; VMT vitreomacular traction; MH Macular hole;  NVD neovascularization of the disc; NVE neovascularization elsewhere; AREDS age related eye disease study; ARMD age related macular degeneration; POAG primary open angle glaucoma; EBMD epithelial/anterior basement membrane dystrophy; ACIOL anterior chamber intraocular lens; IOL intraocular lens; PCIOL posterior chamber intraocular lens; Phaco/IOL phacoemulsification with intraocular lens placement; PRK photorefractive keratectomy; LASIK laser assisted  in situ keratomileusis; HTN hypertension; DM diabetes mellitus; COPD chronic obstructive pulmonary disease

## 2021-02-26 ENCOUNTER — Encounter (INDEPENDENT_AMBULATORY_CARE_PROVIDER_SITE_OTHER): Payer: BC Managed Care – PPO | Admitting: Ophthalmology

## 2021-03-05 ENCOUNTER — Other Ambulatory Visit: Payer: Self-pay | Admitting: Family Medicine

## 2021-03-05 DIAGNOSIS — R200 Anesthesia of skin: Secondary | ICD-10-CM

## 2021-03-31 ENCOUNTER — Other Ambulatory Visit: Payer: Self-pay

## 2021-03-31 ENCOUNTER — Ambulatory Visit
Admission: RE | Admit: 2021-03-31 | Discharge: 2021-03-31 | Disposition: A | Discharge status: Home / Self Care | Payer: No Typology Code available for payment source | Source: Ambulatory Visit | Attending: Family Medicine | Admitting: Family Medicine

## 2021-03-31 DIAGNOSIS — R200 Anesthesia of skin: Secondary | ICD-10-CM

## 2021-04-04 ENCOUNTER — Encounter: Payer: Self-pay | Admitting: Neurology

## 2021-04-22 ENCOUNTER — Other Ambulatory Visit: Payer: Self-pay

## 2021-04-22 ENCOUNTER — Encounter (INDEPENDENT_AMBULATORY_CARE_PROVIDER_SITE_OTHER): Payer: No Typology Code available for payment source | Admitting: Ophthalmology

## 2021-04-22 ENCOUNTER — Ambulatory Visit (INDEPENDENT_AMBULATORY_CARE_PROVIDER_SITE_OTHER): Payer: No Typology Code available for payment source | Admitting: Ophthalmology

## 2021-04-22 ENCOUNTER — Encounter (INDEPENDENT_AMBULATORY_CARE_PROVIDER_SITE_OTHER): Payer: Self-pay | Admitting: Ophthalmology

## 2021-04-22 DIAGNOSIS — H35711 Central serous chorioretinopathy, right eye: Secondary | ICD-10-CM | POA: Diagnosis not present

## 2021-04-22 NOTE — Progress Notes (Signed)
? ? ?04/22/2021 ? ?  ? ?CHIEF COMPLAINT ?Patient presents for  ?Chief Complaint  ?Patient presents with  ? Retina Follow Up  ? ? ?For history of central serous chorioretinopathy right eye with decreased vision ? ?Notably patient on systemic dose of steroids of 8 mg of prednisone currently ? ?HISTORY OF PRESENT ILLNESS: ?Kyle Guzman is a 51 y.o. male who presents to the clinic today for:  ? ?HPI   ? ? Retina Follow Up   ? ?      ? Diagnosis: Other  ? Laterality: right eye  ? Severity: moderate  ? Course: stable  ? ?  ?  ? ? Comments   ?8 weeks for dilate, OD, OCT. ?Pt stated no changes in vision. ?Pt stated no current floaters but it does appear and then goes away. ? ? ? ?  ?  ?Last edited by Silvestre Moment on 04/22/2021  9:50 AM.  ?  ? ? ?Referring physician: ?Leonard Downing, MD ?[address] ?[place],  [place] [address] ? ?HISTORICAL INFORMATION:  ? ?Selected notes from the [facility] ?  ?   ? ?CURRENT MEDICATIONS: ?No current outpatient medications on file. (Ophthalmic Drugs)  ? ?No current facility-administered medications for this visit. (Ophthalmic Drugs)  ? ?Current Outpatient Medications (Other)  ?Medication Sig  ? azithromycin (ZITHROMAX) 250 MG tablet Take 1 tablet (250 mg total) by mouth daily. Take first 2 tablets together, then 1 every day until finished.  ? benzonatate (TESSALON) 100 MG capsule Take 1-2 capsules (100-200 mg total) by mouth 3 (three) times daily as needed.  ? cetirizine (ZYRTEC ALLERGY) 10 MG tablet Take 1 tablet (10 mg total) by mouth daily.  ? magic mouthwash (nystatin, lidocaine, diphenhydrAMINE, alum & mag hydroxide) suspension Swish and swallow 5 mLs 3 (three) times daily as needed for mouth pain.  ? mupirocin ointment (BACTROBAN) 2 % Apply 1 application topically 2 (two) times daily. To skin sores  ? mycophenolate (CELLCEPT) 500 MG tablet Take 1 tablet by mouth daily.  ? potassium chloride SA (KLOR-CON) 20 MEQ tablet Take 2 tablets (40 mEq total) by mouth 2 (two) times daily.  ?  promethazine-dextromethorphan (PROMETHAZINE-DM) 6.25-15 MG/5ML syrup Take 5 mLs by mouth at bedtime as needed for cough.  ? pseudoephedrine (SUDAFED) 60 MG tablet Take 1 tablet (60 mg total) by mouth every 8 (eight) hours as needed for congestion.  ? ?No current facility-administered medications for this visit. (Other)  ? ? ? ? ?REVIEW OF SYSTEMS: ?ROS   ?Negative for: Constitutional, Gastrointestinal, Neurological, Skin, Genitourinary, Musculoskeletal, HENT, Endocrine, Cardiovascular, Eyes, Respiratory, Psychiatric, Allergic/Imm, Heme/Lymph ?Last edited by Silvestre Moment on 04/22/2021  9:50 AM.  ?  ? ? ? ?ALLERGIES ?No Known Allergies ? ?PAST MEDICAL HISTORY ?Past Medical History:  ?Diagnosis Date  ? PNA (pneumonia)   ? ?History reviewed. No pertinent surgical history. ? ?FAMILY HISTORY ?Family History  ?Problem Relation Age of Onset  ? Healthy Mother   ? ? ?SOCIAL HISTORY ?Social History  ? ?Tobacco Use  ? Smoking status: Former  ? Smokeless tobacco: Former  ?  Quit date: 10/27/2010  ?Vaping Use  ? Vaping Use: Never used  ?Substance Use Topics  ? Alcohol use: No  ? Drug use: No  ? ?  ? ?  ? ?OPHTHALMIC EXAM: ? ?Base Eye Exam   ? ? Visual Acuity (ETDRS)   ? ?   Right Left  ? Dist cc 20/25 -2 20/30 -2  ? ? Correction: Glasses  ? ?  ?  ? ?  Tonometry (Tonopen, 9:58 AM)   ? ?   Right Left  ? Pressure 15 15  ? ?  ?  ? ? Pupils   ? ?   Pupils Dark Light Shape React APD  ? Right PERRL 4 3 Round Brisk None  ? Left PERRL 4 3 Round Brisk None  ? ?  ?  ? ? Visual Fields   ? ?   Left Right  ?  Full Full  ? ?  ?  ? ? Extraocular Movement   ? ?   Right Left  ?  Full Full  ? ?  ?  ? ? Neuro/Psych   ? ? Oriented x3: Yes  ? Mood/Affect: Normal  ? ?  ?  ? ? Dilation   ? ? Right eye: 1.0% Mydriacyl, 2.5% Phenylephrine @ 9:57 AM  ? ?  ?  ? ?  ? ?Slit Lamp and Fundus Exam   ? ? External Exam   ? ?   Right Left  ? External Normal Normal  ? ?  ?  ? ? Slit Lamp Exam   ? ?   Right Left  ? Lids/Lashes Normal Normal  ? Conjunctiva/Sclera White and  quiet White and quiet  ? Cornea Clear Clear  ? Anterior Chamber Deep and quiet Deep and quiet  ? Iris Round and reactive Round and reactive  ? Lens Clear Clear  ? Anterior Vitreous Normal Normal  ? ?  ?  ? ? Fundus Exam   ? ?   Right Left  ? Posterior Vitreous Normal Normal  ? Disc Normal Normal  ? C/D Ratio 0.25 0.25  ? Macula No serous retinal detachment in the fovea, no macular thickening, no membrane Normal  ? Vessels Normal Normal  ? Periphery Normal Normal  ? ?  ?  ? ?  ? ? ?IMAGING AND PROCEDURES  ?Imaging and Procedures for 04/22/21 ? ?OCT, Retina - OU - Both Eyes   ? ?   ?Right Eye ?Quality was good. Scan locations included subfoveal. Central Foveal Thickness: 260. Findings include abnormal foveal contour, vitreomacular adhesion .  ? ?Left Eye ?Central Foveal Thickness: 274. Progression has no prior data. Findings include normal foveal contour, vitreomacular adhesion .  ? ?Notes ?Complete resolution of serous subfoveal detachment classic CSCR, in the macular region OD as compared to February 2023 and vastly improved as compared to September 2022.  ? ?  ? ? ?  ?  ? ?  ?ASSESSMENT/PLAN: ? ?Acute central serous retinopathy with subretinal fluid, right ?Now some 7 months post micro pulse FFA guided subthreshold yellow laser to the causative lesion, concomitantly patient has had a decreasing tapering dose of oral systemic steroids for another systemic condition  ? ?  ICD-10-CM   ?1. Acute central serous retinopathy with subretinal fluid, right  H35.711 OCT, Retina - OU - Both Eyes  ?  ? ? ?1.  Complete resolution of CS CR OD post micro pulse FFA guided subthreshold yellow laser to the causative lesion.  More over concomitant with declining dose of systemic steroid use. ? ?2.  Follow-up again next in 4 months or as needed ? ?3.  Note the OCT findings were reviewed with the patient and visualized for understanding purposes ? ?Ophthalmic Meds Ordered this visit:  ?No orders of the defined types were placed in this  encounter. ? ? ?  ? ?Return in about 4 months (around 08/22/2021) for DILATE OU, COLOR FP, OCT. ? ?There are no Patient  Instructions on file for this visit. ? ? ?Explained the diagnoses, plan, and follow up with the patient and they expressed understanding.  Patient expressed understanding of the importance of proper follow up care.  ? ?Clent Demark. Dewana Ammirati M.D. ?Diseases & Surgery of the Retina and Vitreous ?[facility] ?04/22/21 ? ? ? ? ?Abbreviations: ?M myopia (nearsighted); A astigmatism; H hyperopia (farsighted); P presbyopia; Mrx spectacle prescription;  CTL contact lenses; OD right eye; OS left eye; OU both eyes  XT exotropia; ET esotropia; PEK punctate epithelial keratitis; PEE punctate epithelial erosions; DES dry eye syndrome; MGD meibomian gland dysfunction; ATs artificial tears; PFAT's preservative free artificial tears; Van nuclear sclerotic cataract; PSC posterior subcapsular cataract; ERM epi-retinal membrane; PVD posterior vitreous detachment; RD retinal detachment; DM diabetes mellitus; DR diabetic retinopathy; NPDR non-proliferative diabetic retinopathy; PDR proliferative diabetic retinopathy; CSME clinically significant macular edema; DME diabetic macular edema; dbh dot blot hemorrhages; CWS cotton wool spot; POAG primary open angle glaucoma; C/D cup-to-disc ratio; HVF humphrey visual field; GVF goldmann visual field; OCT optical coherence tomography; IOP intraocular pressure; BRVO Branch retinal vein occlusion; CRVO central retinal vein occlusion; CRAO central retinal artery occlusion; BRAO branch retinal artery occlusion; RT retinal tear; SB scleral buckle; PPV pars plana vitrectomy; VH Vitreous hemorrhage; PRP panretinal laser photocoagulation; IVK intravitreal kenalog; VMT vitreomacular traction; MH Macular hole;  NVD neovascularization of the disc; NVE neovascularization elsewhere; AREDS age related eye disease study; ARMD age related macular degeneration; POAG primary open angle  glaucoma; EBMD epithelial/anterior basement membrane dystrophy; ACIOL anterior chamber intraocular lens; IOL intraocular lens; PCIOL posterior chamber intraocular lens; Phaco/IOL phacoemulsification with in

## 2021-04-22 NOTE — Assessment & Plan Note (Signed)
Now some 7 months post micro pulse FFA guided subthreshold yellow laser to the causative lesion, concomitantly patient has had a decreasing tapering dose of oral systemic steroids for another systemic condition ?

## 2021-04-28 ENCOUNTER — Other Ambulatory Visit (INDEPENDENT_AMBULATORY_CARE_PROVIDER_SITE_OTHER): Payer: No Typology Code available for payment source

## 2021-04-28 ENCOUNTER — Ambulatory Visit (INDEPENDENT_AMBULATORY_CARE_PROVIDER_SITE_OTHER): Payer: No Typology Code available for payment source | Admitting: Neurology

## 2021-04-28 ENCOUNTER — Encounter: Payer: Self-pay | Admitting: Neurology

## 2021-04-28 VITALS — BP 125/77 | HR 78 | Ht 66.0 in | Wt 152.0 lb

## 2021-04-28 DIAGNOSIS — R200 Anesthesia of skin: Secondary | ICD-10-CM

## 2021-04-28 DIAGNOSIS — R29898 Other symptoms and signs involving the musculoskeletal system: Secondary | ICD-10-CM

## 2021-04-28 DIAGNOSIS — R292 Abnormal reflex: Secondary | ICD-10-CM

## 2021-04-28 DIAGNOSIS — R202 Paresthesia of skin: Secondary | ICD-10-CM | POA: Diagnosis not present

## 2021-04-28 LAB — B12 AND FOLATE PANEL
Folate: 19.3 ng/mL (ref >5.9)
Vitamin B-12: 243 pg/mL (ref 211–911)

## 2021-04-28 LAB — C-REACTIVE PROTEIN: CRP: <1 mg/dL (ref 0.5–20.0)

## 2021-04-28 LAB — SEDIMENTATION RATE: Sed Rate: 12 mm/hr (ref 0–20)

## 2021-04-28 LAB — CK: Total CK: 73 U/L (ref 7–232)

## 2021-04-28 MED ORDER — GABAPENTIN 300 MG PO CAPS: 300.0000 mg | ORAL_CAPSULE | Freq: Every day | ORAL | 5 refills | Status: DC

## 2021-04-28 NOTE — Progress Notes (Signed)
?[facility] ?Neurology Division ?Clinic Note - Initial Visit ? ? ?Date: 04/28/21 ? ?Annie Sable ?MRN: [id number] ?DOB: 07-21-1970 ? ? ?Dear Dr. Arelia Sneddon: ? ?Thank you for your kind referral of Kyle Guzman for consultation of left side weakness and tingling. Although his history is well known to you, please allow [place] to reiterate it for the purpose of our medical record. The patient was accompanied to the clinic by self. ? ?  ? ?History of Present Illness: ?Kyle Guzman is a 51 y.o. right-handed male with pemphigus presenting for evaluation of left side numbness and weakness. Starting in early 2022, he began left arm and leg numbness, which is constant.  He has numbness over the left hand and foot.  No specific triggers or alleviating factors.  No neck pain.   He ocassionally drops things from the left hand and feels that the strength on the left arm is weaker than the right.  No left leg weakness or falls.  No numbness/tingling on the face.  Prior testing includes MRI brain wo contrast and MRI cervical spine which was normal.  ? ?Out-side paper records, electronic medical record, and images have been reviewed where available and summarized as:  ?MRI brain wo contrast 03/31/2021: ?No acute or significant abnormality identified. ? ?MRI cervical spine wo contrast 03/31/2021: ?No acute or significant abnormality identified. ? ? ?Past Medical History:  ?Diagnosis Date  ? Pemphigus   ? PNA (pneumonia)   ? ? ?History reviewed. No pertinent surgical history. ? ? ?Medications:  ?Outpatient Encounter Medications as of 04/28/2021  ?Medication Sig  ? Calcium Carb-Cholecalciferol (CALCIUM 600/VITAMIN D PO) Take by mouth. Take 2 tablets daily  ? cetirizine (ZYRTEC ALLERGY) 10 MG tablet Take 1 tablet (10 mg total) by mouth daily.  ? magic mouthwash (nystatin, lidocaine, diphenhydrAMINE, alum & mag hydroxide) suspension Swish and swallow 5 mLs 3 (three) times daily as needed for mouth pain.  ? methylPREDNISolone (MEDROL) 4 MG tablet Take by  mouth daily.  ? mupirocin ointment (BACTROBAN) 2 % Apply 1 application topically 2 (two) times daily. To skin sores  ? mycophenolate (CELLCEPT) 500 MG tablet Take 1 tablet by mouth daily.  ? [DISCONTINUED] azithromycin (ZITHROMAX) 250 MG tablet Take 1 tablet (250 mg total) by mouth daily. Take first 2 tablets together, then 1 every day until finished. (Patient not taking: Reported on 04/28/2021)  ? [DISCONTINUED] benzonatate (TESSALON) 100 MG capsule Take 1-2 capsules (100-200 mg total) by mouth 3 (three) times daily as needed. (Patient not taking: Reported on 04/28/2021)  ? [DISCONTINUED] potassium chloride SA (KLOR-CON) 20 MEQ tablet Take 2 tablets (40 mEq total) by mouth 2 (two) times daily. (Patient not taking: Reported on 04/28/2021)  ? [DISCONTINUED] promethazine-dextromethorphan (PROMETHAZINE-DM) 6.25-15 MG/5ML syrup Take 5 mLs by mouth at bedtime as needed for cough. (Patient not taking: Reported on 04/28/2021)  ? [DISCONTINUED] pseudoephedrine (SUDAFED) 60 MG tablet Take 1 tablet (60 mg total) by mouth every 8 (eight) hours as needed for congestion. (Patient not taking: Reported on 04/28/2021)  ? ?No facility-administered encounter medications on file as of 04/28/2021.  ? ? ?Allergies: No Known Allergies ? ?Family History: ?Family History  ?Problem Relation Age of Onset  ? Healthy Mother   ? ? ?Social History: ?Social History  ? ?Tobacco Use  ? Smoking status: Former  ? Smokeless tobacco: Former  ?  Quit date: 10/27/2010  ?Vaping Use  ? Vaping Use: Never used  ?Substance Use Topics  ? Alcohol use: No  ? Drug use: No  ? ?  Social History  ? ?Social History Narrative  ? Right Handed   ? Lives in a one story home   ? ? ?Vital Signs:  ?BP 125/77   Pulse 78   Ht '5\' 6"'  (1.676 m)   Wt 152 lb (68.9 kg)   SpO2 97%   BMI 24.53 kg/m?  ? ? ?Neurological Exam: ?MENTAL STATUS including orientation to time, place, person, recent and remote memory, attention span and concentration, language, and fund of knowledge is normal.   Speech is not dysarthric. ? ?CRANIAL NERVES: ?II:  No visual field defects.   ?III-IV-VI: Pupils equal round and reactive to light.  Normal conjugate, extra-ocular eye movements in all directions of gaze.  No nystagmus.  No ptosis.   ?V:  Normal facial sensation.    ?VII:  Normal facial symmetry and movements.   ?VIII:  Normal hearing and vestibular function.   ?IX-X:  Normal palatal movement.   ?XI:  Normal shoulder shrug and head rotation.   ?XII:  Normal tongue strength and range of motion, no deviation or fasciculation. ? ?MOTOR:  Mildly reduced muscle bulk in the left arm, as compared to the right.  No atrophy, fasciculations or abnormal movements.  No pronator drift.  ? ?Upper Extremity:  Right  Left  ?Deltoid  5/5   5-/5   ?Biceps  5/5   5-/5   ?Triceps  5/5   5/5   ?Infraspinatus 5/5  5/5  ?Medial pectoralis 5/5  5/5  ?Wrist extensors  5/5   5/5   ?Wrist flexors  5/5   5/5   ?Finger extensors  5/5   5/5   ?Finger flexors  5/5   5/5   ?Dorsal interossei  5/5   5/5   ?Abductor pollicis  5/5   5/5   ?Tone (Ashworth scale)  0  0  ? ?Lower Extremity:  Right  Left  ?Hip flexors  5/5   5/5   ?Hip extensors  5/5   5/5   ?Adductor 5/5  5/5  ?Abductor 5/5  5/5  ?Knee flexors  5/5   5/5   ?Knee extensors  5/5   5/5   ?Dorsiflexors  5/5   5/5   ?Plantarflexors  5/5   5/5   ?Toe extensors  5/5   5/5   ?Toe flexors  5/5   5/5   ?Tone (Ashworth scale)  0  0  ? ?MSRs:  ?Right        Left                  ?brachioradialis 2+  2+  ?biceps 2+  2+  ?triceps 2+  2+  ?patellar 2+  2+  ?ankle jerk 2+  2+  ?Hoffman no  no  ?plantar response down  down  ? ?SENSORY:  Normal and symmetric perception of light touch, pinprick, vibration, and proprioception.  ? ?COORDINATION/GAIT: Normal finger-to- nose-finger.  Intact rapid alternating movements bilaterally.   Gait narrow based and stable. Tandem and stressed gait intact.  ? ? ?IMPRESSION: ?Left arm and leg paresthesias and weakness (mild).  No evidence of CNS pathology on MRI brain and  cervical spine, which was personally viewed and reviewed with radiology.  Next step is to evaluate for peripheral nerve pathology with NCS/EMG and laboratory testing.  ? ?PLAN/RECOMMENDATIONS:  ?Check ESR, CRP, CK, vitamin B12, folate, copper, zinc ?Start gabapentin 314m at bedtime ?NCS/EMG of the left arm and leg ?Consider PT going forward ? ?Further recommendations pending results. ? ? ?  Thank you for allowing me to participate in patient's care.  If I can answer any additional questions, I would be pleased to do so.   ? ?Sincerely, ? ? ? ?Nili Honda K. Posey Pronto, DO ? ?

## 2021-04-28 NOTE — Patient Instructions (Addendum)
Start gabapentin 300mg  at bedtime ? ?Check labs ? ?After discussing your MRI with radiology, my office will be back in touch to see if we need to repeat it ? ?Return to clinic 6 months ?

## 2021-04-30 LAB — ZINC: Zinc: 64 ug/dL (ref 60–130)

## 2021-04-30 LAB — COPPER, SERUM: Copper: 73 ug/dL (ref 70–175)

## 2021-05-01 ENCOUNTER — Ambulatory Visit (INDEPENDENT_AMBULATORY_CARE_PROVIDER_SITE_OTHER): Payer: No Typology Code available for payment source | Admitting: Neurology

## 2021-05-01 DIAGNOSIS — R200 Anesthesia of skin: Secondary | ICD-10-CM

## 2021-05-01 DIAGNOSIS — R202 Paresthesia of skin: Secondary | ICD-10-CM

## 2021-05-01 NOTE — Procedures (Signed)
[facility]  ?[address] ? [place], [place] [address] ?Tel: [phone] ?Fax:  [phone] ?Test Date:  05/01/2021 ? ?Patient: Kyle Guzman DOB: [birth year] Physician: Narda Amber, DO  ?Sex: Male Height: 5\' 6"  Ref Phys: Narda Amber, DO  ?ID#: [id number]   Technician:   ? ?Patient Complaints: ?This is a 51 year old man referred for evaluation of left-sided numbness and, tingling, and weakness. ? ?NCV & EMG Findings: ?Extensive electrodiagnostic testing of the left upper and lower extremity shows:  ?Left median, ulnar, mixed palmar, sural, and superficial peroneal sensory responses are within normal limits. ?Left median, peroneal, and tibial motor responses are within normal limits.  Left ulnar motor response shows slowed conduction velocity across the elbow (A Elbow-B Elbow, 45 m/s).  ?Left tibial H reflex study is within normal limits.  ?There is no evidence of active or chronic motor axonal loss changes affecting any of the tested muscles.  Motor unit configuration and recruitment pattern is within normal limits.   ? ?Impression: ?Left ulnar neuropathy with slowing across the elbow, demyelinating, mild. ?There is no evidence of a sensorimotor polyneuropathy or cervical/lumbosacral radiculopathy affecting the left side. ? ? ?___________________________ ?Narda Amber, DO ? ? ? ?Nerve Conduction Studies ?Anti Sensory Summary Table ? ? Stim Site NR Peak (ms) Norm Peak (ms) P-T Amp (?V) Norm P-T Amp  ?Left Median Anti Sensory (2nd Digit)  34?C  ?Wrist    2.9 <3.6 23.2 >15  ?Left Sup Peroneal Anti Sensory (Ant Lat Mall)  34?C  ?12 cm    2.4 <4.6 20.9 >4  ?Left Sural Anti Sensory (Lat Mall)  34?C  ?Calf    2.2 <4.6 28.6 >4  ?Left Ulnar Anti Sensory (5th Digit)  34?C  ?Wrist    2.4 <3.1 24.1 >10  ? ?Motor Summary Table ? ? Stim Site NR Onset (ms) Norm Onset (ms) O-P Amp (mV) Norm O-P Amp Site1 Site2 Delta-0 (ms) Dist (cm) Vel (m/s) Norm Vel (m/s)  ?Left Median Motor (Abd Poll Brev)  34?C  ?Wrist     3.0 <4.0 10.2 >6 Elbow Wrist 5.1 29.0 57 >50  ?Elbow    8.1  9.9         ?Left Peroneal Motor (Ext Dig Brev)  34?C  ?Ankle    3.8 <6.0 2.6 >2.5 B Fib Ankle 7.0 37.0 53 >40  ?B Fib    10.8  2.3  Poplt B Fib 1.4 7.0 50 >40  ?Poplt    12.2  2.1         ?Left Tibial Motor (Abd [place] Crane Brev)  34?C  ?Ankle    5.2 <6.0 17.1 >4 Knee Ankle 7.1 40.0 56 >40  ?Knee    12.3  14.2         ?Left Ulnar Motor (Abd Dig Minimi)  34?C  ?Wrist    1.8 <3.1 9.9 >7 B Elbow Wrist 3.3 22.0 67 >50  ?B Elbow    5.1  9.3  A Elbow B Elbow 2.2 10.0 45 >50  ?A Elbow    7.3  9.0         ? ?Comparison Summary Table ? ? Stim Site NR Peak (ms) Norm Peak (ms) P-T Amp (?V) Site1 Site2 Delta-P (ms) Norm Delta (ms)  ?Left Median/Ulnar Palm Comparison (Wrist - 8cm)  34?C  ?Median Palm    2.0 <2.2 61.2 Median Palm Ulnar Palm 0.2   ?Ulnar Palm    1.8 <2.2 25.7      ? ?H  Reflex Studies ? ? NR H-Lat (ms) Lat Norm (ms) L-R H-Lat (ms)  ?Left Tibial (Gastroc)  34?C  ?   31.84 <35   ? ?EMG ? ? Side Muscle Ins Act Fibs Psw Fasc Number Recrt Dur Dur. Amp Amp. Poly Poly. Comment  ?Left AntTibialis Nml Nml Nml Nml Nml Nml Nml Nml Nml Nml Nml Nml N/A  ?Left Gastroc Nml Nml Nml Nml Nml Nml Nml Nml Nml Nml Nml Nml N/A  ?Left Flex Dig Long Nml Nml Nml Nml Nml Nml Nml Nml Nml Nml Nml Nml N/A  ?Left RectFemoris Nml Nml Nml Nml Nml Nml Nml Nml Nml Nml Nml Nml N/A  ?Left GluteusMed Nml Nml Nml Nml Nml Nml Nml Nml Nml Nml Nml Nml N/A  ?Left 1stDorInt Nml Nml Nml Nml Nml Nml Nml Nml Nml Nml Nml Nml N/A  ?Left PronatorTeres Nml Nml Nml Nml Nml Nml Nml Nml Nml Nml Nml Nml N/A  ?Left Biceps Nml Nml Nml Nml Nml Nml Nml Nml Nml Nml Nml Nml N/A  ?Left Triceps Nml Nml Nml Nml Nml Nml Nml Nml Nml Nml Nml Nml N/A  ?Left Deltoid Nml Nml Nml Nml Nml Nml Nml Nml Nml Nml Nml Nml N/A  ?Left FlexCarpiUln Nml Nml Nml Nml Nml Nml Nml Nml Nml Nml Nml Nml N/A  ? ? ? ? ?Waveforms: ?    ? ?    ? ?    ? ?  ? ? ?

## 2021-08-25 ENCOUNTER — Encounter (INDEPENDENT_AMBULATORY_CARE_PROVIDER_SITE_OTHER): Payer: No Typology Code available for payment source | Admitting: Ophthalmology

## 2021-08-25 ENCOUNTER — Ambulatory Visit (INDEPENDENT_AMBULATORY_CARE_PROVIDER_SITE_OTHER): Payer: No Typology Code available for payment source | Admitting: Ophthalmology

## 2021-08-25 ENCOUNTER — Encounter (INDEPENDENT_AMBULATORY_CARE_PROVIDER_SITE_OTHER): Payer: Self-pay | Admitting: Ophthalmology

## 2021-08-25 DIAGNOSIS — H2513 Age-related nuclear cataract, bilateral: Secondary | ICD-10-CM | POA: Diagnosis not present

## 2021-08-25 DIAGNOSIS — H35711 Central serous chorioretinopathy, right eye: Secondary | ICD-10-CM

## 2021-08-25 DIAGNOSIS — L109 Pemphigus, unspecified: Secondary | ICD-10-CM | POA: Insufficient documentation

## 2021-08-25 NOTE — Assessment & Plan Note (Signed)
OD doing very well with complete resolution of serous retinal detachment status post navigated focal laser.

## 2021-08-25 NOTE — Assessment & Plan Note (Signed)
Currently not active

## 2021-08-25 NOTE — Assessment & Plan Note (Signed)

## 2021-08-25 NOTE — Progress Notes (Signed)
08/25/2021     CHIEF COMPLAINT Patient presents for  Chief Complaint  Patient presents with   Retina Follow Up   Retina Evaluation      HISTORY OF PRESENT ILLNESS: Kyle Guzman is a 51 y.o. male who presents to the clinic today for:   HPI     Retina Follow Up           Diagnosis: Other   Laterality: right eye   Severity: moderate   Course: stable         Retina Evaluation           Laterality: right eye         Comments   Hx of CSCR OD, status post focal laser treatment with subthreshold laser September 2022 navigated laser, Dr. Zadie Rhine  4 MOS for DILATE OU, COLOR FP, OCT. Pt stated vision has remained stable since last visit.  No interval change in vision overall and better        Last edited by Hurman Horn, MD on 08/25/2021  9:24 AM.      Referring physician: Leonard Downing, MD [address] [place],  [place] [address]  HISTORICAL INFORMATION:   Selected notes from the MEDICAL RECORD NUMBER       CURRENT MEDICATIONS: No current outpatient medications on file. (Ophthalmic Drugs)   No current facility-administered medications for this visit. (Ophthalmic Drugs)   Current Outpatient Medications (Other)  Medication Sig   Calcium Carb-Cholecalciferol (CALCIUM 600/VITAMIN D PO) Take by mouth. Take 2 tablets daily   cetirizine (ZYRTEC ALLERGY) 10 MG tablet Take 1 tablet (10 mg total) by mouth daily.   gabapentin (NEURONTIN) 300 MG capsule Take 1 capsule (300 mg total) by mouth at bedtime.   magic mouthwash (nystatin, lidocaine, diphenhydrAMINE, alum & mag hydroxide) suspension Swish and swallow 5 mLs 3 (three) times daily as needed for mouth pain.   methylPREDNISolone (MEDROL) 4 MG tablet Take by mouth daily.   mupirocin ointment (BACTROBAN) 2 % Apply 1 application topically 2 (two) times daily. To skin sores   mycophenolate (CELLCEPT) 500 MG tablet Take 1 tablet by mouth daily.   No current facility-administered medications for this visit.  (Other)      REVIEW OF SYSTEMS: ROS   Negative for: Constitutional, Gastrointestinal, Neurological, Skin, Genitourinary, Musculoskeletal, HENT, Endocrine, Cardiovascular, Eyes, Respiratory, Psychiatric, Allergic/Imm, Heme/Lymph Last edited by Silvestre Moment on 08/25/2021  8:51 AM.       ALLERGIES No Known Allergies  PAST MEDICAL HISTORY Past Medical History:  Diagnosis Date   Pemphigus    PNA (pneumonia)    History reviewed. No pertinent surgical history.  FAMILY HISTORY Family History  Problem Relation Age of Onset   Healthy Mother     SOCIAL HISTORY Social History   Tobacco Use   Smoking status: Former   Smokeless tobacco: Former    Quit date: 10/27/2010  Vaping Use   Vaping Use: Never used  Substance Use Topics   Alcohol use: No   Drug use: No         OPHTHALMIC EXAM:  Base Eye Exam     Visual Acuity (ETDRS)       Right Left   Dist cc 20/25 -2 20/20    Correction: Glasses         Tonometry (Tonopen, 8:55 AM)       Right Left   Pressure 19 21         Pupils  Pupils APD   Right PERRL None   Left PERRL None         Visual Fields       Left Right    Full Full         Extraocular Movement       Right Left    Full, Ortho Full, Ortho         Neuro/Psych     Oriented x3: Yes   Mood/Affect: Normal         Dilation     Both eyes: 1.0% Mydriacyl, 2.5% Phenylephrine @ 8:55 AM           Slit Lamp and Fundus Exam     External Exam       Right Left   External Normal Normal         Slit Lamp Exam       Right Left   Lids/Lashes Normal Normal   Conjunctiva/Sclera White and quiet White and quiet   Cornea Clear Clear   Anterior Chamber Deep and quiet Deep and quiet   Iris Round and reactive Round and reactive   Lens 1+ Nuclear sclerosis 1+ Nuclear sclerosis   Anterior Vitreous Normal Normal         Fundus Exam       Right Left   Posterior Vitreous Normal Normal   Disc Normal Normal   C/D Ratio 0.25  0.25   Macula No serous retinal detachment in the fovea, no macular thickening, no membrane Normal   Vessels Normal Normal   Periphery Normal Normal            IMAGING AND PROCEDURES  Imaging and Procedures for 08/25/21  OCT, Retina - OU - Both Eyes       Right Eye Quality was good. Scan locations included subfoveal. Central Foveal Thickness: 242. Progression has improved. Findings include normal foveal contour, vitreomacular adhesion .   Left Eye Quality was good. Scan locations included subfoveal. Central Foveal Thickness: 270. Progression has been stable. Findings include normal foveal contour, vitreomacular adhesion .   Notes Complete resolution of serous subfoveal detachment classic CSCR, in the macular region OD as compared to February 2023 and vastly improved as compared to September 2022.   Will observe OD     Color Fundus Photography Optos - OU - Both Eyes       Right Eye Progression has no prior data. Disc findings include normal observations. Vessels : normal observations. Periphery : normal observations.   Left Eye Progression has no prior data. Disc findings include normal observations. Macula : normal observations. Vessels : normal observations. Periphery : normal observations.   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  Nuclear sclerotic cataract of both eyes The nature of cataract was discussed with the patient as well as the elective nature of surgery. The patient was reassured that surgery at a later date does not put the patient at risk for a worse outcome. It was emphasized that the need for surgery is dictated by the patient's quality of life as influenced by the cataract. Patient was instructed to maintain close follow up with their general eye care  doctor.  Pemphigus Currently not active     ICD-10-CM   1. 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 Nuclear sclerotic cataract of both eyes  H25.13     3. Pemphigus  L10.9       1.  CS CR has resolved now post cessation of steroids but also post focal laser delivered September 2022.  OD.  2.  NSC cataract OU.  No symptoms  3.  Patient understands the critical need for avoidance of corticosteroids unless patient has significant life saving requirement and/or prevention of severe disease progression systemically.  Ophthalmic Meds Ordered this visit:  No orders of the defined types were placed in this encounter.      Return in about 1 year (around 08/26/2022) for DILATE OU, COLOR FP, OCT.  Patient Instructions  Patient instructed that if he ever needs systemic steroids that this condition could worsen or redevelop in the right or the left eye.  Last steroids are appropriate to use systemically if and required for lifesaving measures.  Explained the diagnoses, plan, and follow up with the patient and they expressed understanding.  Patient expressed understanding of the importance of proper follow up care.   Alford Highland Kjersten Ormiston M.D. Diseases & Surgery of the Retina and Vitreous [facility] 08/25/21     Abbreviations: M myopia (nearsighted); A astigmatism; H hyperopia (farsighted); P presbyopia; Mrx spectacle prescription;  CTL contact lenses; OD right eye; OS left eye; OU both eyes  XT exotropia; ET esotropia; PEK punctate epithelial keratitis; PEE punctate epithelial erosions; DES dry eye syndrome; MGD meibomian gland dysfunction; ATs artificial tears; PFAT's preservative free artificial tears; NSC nuclear sclerotic cataract; PSC posterior subcapsular cataract; ERM epi-retinal membrane; PVD posterior vitreous detachment; RD retinal detachment; DM  diabetes mellitus; DR diabetic retinopathy; NPDR non-proliferative diabetic retinopathy; PDR proliferative diabetic retinopathy; CSME clinically significant macular edema; DME diabetic macular edema; dbh dot blot hemorrhages; CWS cotton wool spot; POAG primary open angle glaucoma; C/D cup-to-disc ratio; HVF humphrey visual field; GVF goldmann visual field; OCT optical coherence tomography; IOP intraocular pressure; BRVO Branch retinal vein occlusion; CRVO central retinal vein occlusion; CRAO central retinal artery occlusion; BRAO branch retinal artery occlusion; RT retinal tear; SB scleral buckle; PPV pars plana vitrectomy; VH Vitreous hemorrhage; PRP panretinal laser photocoagulation; IVK intravitreal kenalog; VMT vitreomacular traction; MH Macular hole;  NVD neovascularization of the disc; NVE neovascularization elsewhere; AREDS age related eye disease study; ARMD age related macular degeneration; POAG primary open angle glaucoma; EBMD epithelial/anterior basement membrane dystrophy; ACIOL anterior chamber intraocular lens; IOL intraocular lens; PCIOL posterior chamber intraocular lens; Phaco/IOL phacoemulsification with intraocular lens placement; PRK photorefractive keratectomy; LASIK laser assisted in situ keratomileusis; HTN hypertension; DM diabetes mellitus; COPD chronic obstructive pulmonary disease

## 2021-08-25 NOTE — Patient Instructions (Signed)
Patient instructed that if he ever needs systemic steroids that this condition could worsen or redevelop in the right or the left eye.  Last steroids are appropriate to use systemically if and required for lifesaving measures.

## 2021-09-01 ENCOUNTER — Ambulatory Visit: Payer: No Typology Code available for payment source | Admitting: Neurology

## 2021-11-05 ENCOUNTER — Other Ambulatory Visit: Payer: Self-pay | Admitting: Neurology

## 2021-12-17 ENCOUNTER — Encounter: Payer: Self-pay | Admitting: Neurology

## 2021-12-17 ENCOUNTER — Ambulatory Visit (INDEPENDENT_AMBULATORY_CARE_PROVIDER_SITE_OTHER): Payer: No Typology Code available for payment source | Admitting: Neurology

## 2021-12-17 VITALS — BP 117/73 | HR 68 | Ht 66.0 in | Wt 151.0 lb

## 2021-12-17 DIAGNOSIS — R202 Paresthesia of skin: Secondary | ICD-10-CM

## 2021-12-17 DIAGNOSIS — R200 Anesthesia of skin: Secondary | ICD-10-CM

## 2021-12-17 DIAGNOSIS — R7989 Other specified abnormal findings of blood chemistry: Secondary | ICD-10-CM | POA: Diagnosis not present

## 2021-12-17 NOTE — Patient Instructions (Signed)
Return to clinic in 6 months.

## 2021-12-17 NOTE — Progress Notes (Signed)
Follow-up Visit   Date: 12/17/2021    Kyle Guzman MRN: [id number] DOB: [birth year]    Kyle Guzman is a 51 y.o. right-handed male with pemphigus returning to the clinic for follow-up of left sided numbness and weakness.  The patient was accompanied to the clinic by self.   IMPRESSION/PLAN: Left side paresthesias and weakness, most likely due to low vitamin B12, improving.  He continues to have intermittent left arm and leg numbness.  Prior work-up has involved MRI brain, MRI cervical spine, and NCS/EMG which was normal, except for mild left ulnar neuropathy.  I recommend that he continues vitamin B12 daily.  I will recheck levels in 6 months.  He also has mild left ulnar neuropathy at the elbow.  Strategies to minimize nerve compression/stretching discussed.   Return to clinic in 6 months  --------------------------------------------- History of present illness: Starting in early 2022, he began left arm and leg numbness, which is constant.  He has numbness over the left hand and foot.  No specific triggers or alleviating factors.  No neck pain.   He ocassionally drops things from the left hand and feels that the strength on the left arm is weaker than the right.  No left leg weakness or falls.  No numbness/tingling on the face.  Prior testing includes MRI brain wo contrast and MRI cervical spine which was normal.    UPDATE 12/17/2021:  He is here for follow-up visit.  At his last visit, labs showed low-normal vitamin B12 and he was started on supplementation.  He reports since starting B12, he no longer has numbness in the face or waves of numbness on the left side.  He continues to have intermittent spells of numbness of the left arm and leg.  He no longer has any weakness.  EMG showed mild left ulnar neuropathy across the elbow.  Medications:  Current Outpatient Medications on File Prior to Visit  Medication Sig Dispense Refill   Calcium Carb-Cholecalciferol (CALCIUM 600/VITAMIN D  PO) Take by mouth. Take 2 tablets daily     cetirizine (ZYRTEC ALLERGY) 10 MG tablet Take 1 tablet (10 mg total) by mouth daily. 30 tablet 0   Cholecalciferol 125 MCG (5000 UT) TABS Take by mouth.     Cyanocobalamin (B-12) 500 MCG TABS Take by mouth.     gabapentin (NEURONTIN) 300 MG capsule TAKE 1 CAPSULE(300 MG) BY MOUTH AT BEDTIME 30 capsule 5   magic mouthwash (nystatin, lidocaine, diphenhydrAMINE, alum & mag hydroxide) suspension Swish and swallow 5 mLs 3 (three) times daily as needed for mouth pain. 180 mL 0   methylPREDNISolone (MEDROL) 4 MG tablet Take by mouth daily.     mupirocin ointment (BACTROBAN) 2 % Apply 1 application topically 2 (two) times daily. To skin sores 30 g 0   mycophenolate (CELLCEPT) 500 MG tablet Take 1 tablet by mouth daily.     No current facility-administered medications on file prior to visit.    Allergies: No Known Allergies  Vital Signs:  BP 117/73   Pulse 68   Ht 5\' 6"  (1.676 m)   Wt 151 lb (68.5 kg)   SpO2 98%   BMI 24.37 kg/m     Neurological Exam: MENTAL STATUS including orientation to time, place, person, recent and remote memory, attention span and concentration, language, and fund of knowledge is normal.  Speech is not dysarthric.  CRANIAL NERVES:   Pupils equal round and reactive to light.  Normal conjugate, extra-ocular eye movements in all directions  of gaze.  No ptosis.  Face is symmetric. Palate elevates symmetrically.  Tongue is midline.  MOTOR:  Motor strength is 5/5 in all extremities.  No atrophy, fasciculations or abnormal movements.  No pronator drift.  Tone is normal.    MSRs:  Reflexes are 2+/4 throughout.  SENSORY:  Intact to vibration throughout.  COORDINATION/GAIT:  Normal finger-to- nose-finger.  Intact rapid alternating movements bilaterally.  Gait narrow based and stable.   Data: NCS/EMG 05/01/2021: Left ulnar neuropathy with slowing across the elbow, demyelinating, mild. There is no evidence of a sensorimotor  polyneuropathy or cervical/lumbosacral radiculopathy affecting the left side.  MRI brain wo contrast 03/31/2021: No acute or significant abnormality identified.   MRI cervical spine wo contrast 03/31/2021: No acute or significant abnormality identified.    Lab Results  Component Value Date   VITAMINB12 243 04/28/2021     Thank you for allowing me to participate in patient's care.  If I can answer any additional questions, I would be pleased to do so.    Sincerely,    Merrily Tegeler K. Posey Pronto, DO

## 2021-12-24 ENCOUNTER — Ambulatory Visit
Admission: EM | Admit: 2021-12-24 | Discharge: 2021-12-24 | Disposition: A | Discharge status: Home / Self Care | Payer: No Typology Code available for payment source | Attending: Internal Medicine | Admitting: Internal Medicine

## 2021-12-24 DIAGNOSIS — H6593 Unspecified nonsuppurative otitis media, bilateral: Secondary | ICD-10-CM | POA: Diagnosis not present

## 2021-12-24 DIAGNOSIS — J029 Acute pharyngitis, unspecified: Secondary | ICD-10-CM | POA: Diagnosis present

## 2021-12-24 LAB — POCT RAPID STREP A (OFFICE): Rapid Strep A Screen: NEGATIVE

## 2021-12-24 MED ORDER — FLUTICASONE PROPIONATE 50 MCG/ACT NA SUSP: 1.0000 | Freq: Every day | NASAL | 0 refills | Status: DC

## 2021-12-24 MED ORDER — CETIRIZINE HCL 10 MG PO TABS: 10.0000 mg | ORAL_TABLET | Freq: Every day | ORAL | 0 refills | Status: DC

## 2021-12-24 MED ORDER — FLUTICASONE PROPIONATE 50 MCG/ACT NA SUSP: 1.0000 | Freq: Every day | NASAL | Status: DC

## 2021-12-24 MED ORDER — CHLORASEPTIC 1.4 % MT LIQD: 1.0000 | OROMUCOSAL | 0 refills | Status: AC | PRN

## 2021-12-24 NOTE — ED Triage Notes (Signed)
Pt presents to uc with co of itchy bilateral ears and sore throat for 3 days, pt reports taking otc cold and flu medication.

## 2021-12-24 NOTE — Discharge Instructions (Signed)
Rapid strep test was negative.  Throat culture is pending.  We will call if it is positive.  It appears that your symptoms could be allergy related.  I have prescribed medications to help alleviate fluid from ears and help alleviate sore throat.  Please follow-up if symptoms persist or worsen.

## 2021-12-24 NOTE — ED Provider Notes (Signed)
[facility]    CSN: [id number] Arrival date & time: 12/24/21  9381      History   Chief Complaint Chief Complaint  Patient presents with   Otalgia    HPI Kyle Guzman is a 51 y.o. male.   Patient presents with bilateral itchy ears and sore throat that has been present for 3 days.  He denies any associated nasal congestion, runny nose, cough, fever.  Denies any known sick contacts.  Patient does not report taking medications to help alleviate symptoms.  Denies chest pain or shortness of breath.   Otalgia   Past Medical History:  Diagnosis Date   Pemphigus    PNA (pneumonia)     Patient Active Problem List   Diagnosis Date Noted   Nuclear sclerotic cataract of both eyes 08/25/2021   Pemphigus 08/25/2021   Acute central serous retinopathy with subretinal fluid, right 10/01/2020    History reviewed. No pertinent surgical history.     Home Medications    Prior to Admission medications   Medication Sig Start Date End Date Taking? Authorizing Provider  cetirizine (ZYRTEC) 10 MG tablet Take 1 tablet (10 mg total) by mouth daily. 12/24/21  Yes Sukhman Martine, Rolly Salter E, FNP  phenol (CHLORASEPTIC) 1.4 % LIQD Use as directed 1 spray in the mouth or throat as needed for throat irritation / pain. 12/24/21  Yes Jahne Krukowski, Rolly Salter E, FNP  Calcium Carb-Cholecalciferol (CALCIUM 600/VITAMIN D PO) Take by mouth. Take 2 tablets daily    [provider]  Cholecalciferol 125 MCG (5000 UT) TABS Take by mouth.    [provider]  Cyanocobalamin (B-12) 500 MCG TABS Take by mouth.    [provider]  fluticasone (FLONASE) 50 MCG/ACT nasal spray Place 1 spray into both nostrils daily. 12/24/21   Gustavus Bryant, FNP  magic mouthwash (nystatin, lidocaine, diphenhydrAMINE, alum & mag hydroxide) suspension Swish and swallow 5 mLs 3 (three) times daily as needed for mouth pain. 04/07/20   Wieters, Hallie C, PA-C  methylPREDNISolone (MEDROL) 4 MG tablet Take by mouth daily. 04/10/21    [provider]  mupirocin ointment (BACTROBAN) 2 % Apply 1 application topically 2 (two) times daily. To skin sores 04/07/20   Wieters, Hallie C, PA-C  mycophenolate (CELLCEPT) 500 MG tablet Take 1 tablet by mouth daily. 01/02/21   [provider]    Family History Family History  Problem Relation Age of Onset   Healthy Mother     Social History Social History   Tobacco Use   Smoking status: Former   Smokeless tobacco: Former    Quit date: 10/27/2010  Vaping Use   Vaping Use: Never used  Substance Use Topics   Alcohol use: No   Drug use: No     Allergies   Patient has no known allergies.   Review of Systems Review of Systems Per HPI  Physical Exam Triage Vital Signs ED Triage Vitals  Enc Vitals Group     BP 12/24/21 1002 107/71     Pulse Rate 12/24/21 1002 71     Resp 12/24/21 1002 17     Temp 12/24/21 1002 (!) 97.4 F (36.3 C)     Temp Source 12/24/21 1002 Oral     SpO2 12/24/21 1002 95 %     Weight --      Height --      Head Circumference --      Peak Flow --      Pain Score 12/24/21 1000 6  Pain Loc --      Pain Edu? --      Excl. in [place]? --    No data found.  Updated Vital Signs BP 107/71   Pulse 71   Temp (!) 97.4 F (36.3 C) (Oral)   Resp 17   SpO2 95%   Visual Acuity Right Eye Distance:   Left Eye Distance:   Bilateral Distance:    Right Eye Near:   Left Eye Near:    Bilateral Near:     Physical Exam Constitutional:      General: He is not in acute distress.    Appearance: Normal appearance. He is not toxic-appearing.  HENT:     Head: Normocephalic and atraumatic.     Right Ear: Ear canal normal. No drainage, swelling or tenderness. A middle ear effusion is present. Tympanic membrane is not perforated, erythematous, retracted or bulging.     Left Ear: Ear canal normal. No drainage, swelling or tenderness. A middle ear effusion is present. Tympanic membrane is not perforated, erythematous, retracted or  bulging.     Ears:     Comments: Bilateral external canals normal.     Nose: No congestion.     Mouth/Throat:     Mouth: Mucous membranes are moist.     Pharynx: Posterior oropharyngeal erythema present.  Eyes:     Extraocular Movements: Extraocular movements intact.     Conjunctiva/sclera: Conjunctivae normal.     Pupils: Pupils are equal, round, and reactive to light.  Cardiovascular:     Rate and Rhythm: Normal rate and regular rhythm.     Pulses: Normal pulses.     Heart sounds: Normal heart sounds.  Pulmonary:     Effort: Pulmonary effort is normal. No respiratory distress.     Breath sounds: Normal breath sounds. No stridor. No wheezing, rhonchi or rales.  Abdominal:     General: Abdomen is flat. Bowel sounds are normal.     Palpations: Abdomen is soft.  Musculoskeletal:        General: Normal range of motion.     Cervical back: Normal range of motion.  Skin:    General: Skin is warm and dry.  Neurological:     General: No focal deficit present.     Mental Status: He is alert and oriented to person, place, and time. Mental status is at baseline.  Psychiatric:        Mood and Affect: Mood normal.        Behavior: Behavior normal.      UC Treatments / Results  Labs (all labs ordered are listed, but only abnormal results are displayed) Labs Reviewed  CULTURE, GROUP A STREP [facility])  POCT RAPID STREP A (OFFICE)    EKG   Radiology No results found.  Procedures Procedures (including critical care time)  Medications Ordered in UC Medications - No data to display  Initial Impression / Assessment and Plan / UC Course  I have reviewed the triage vital signs and the nursing notes.  Pertinent labs & imaging results that were available during my care of the patient were reviewed by me and considered in my medical decision making (see chart for details).     Rapid strep test was negative.  Throat culture is pending.  Suspect allergies versus viral illness causing  symptoms.  Patient has fluid behind TM's which is most likely causing ear discomfort.  Treating with antihistamine and Flonase.  Patient denies that he takes any daily antihistamines so this  should be safe.  Patient sent Chloraseptic spray to help alleviate throat discomfort.  Patient advised to follow-up if symptoms persist or worsen.  Patient verbalized understanding and was agreeable with plan. Final Clinical Impressions(s) / UC Diagnoses   Final diagnoses:  Sore throat  Fluid level behind tympanic membrane of both ears     Discharge Instructions      Rapid strep test was negative.  Throat culture is pending.  We will call if it is positive.  It appears that your symptoms could be allergy related.  I have prescribed medications to help alleviate fluid from ears and help alleviate sore throat.  Please follow-up if symptoms persist or worsen.     ED Prescriptions     Medication Sig Dispense Auth. Provider   cetirizine (ZYRTEC) 10 MG tablet Take 1 tablet (10 mg total) by mouth daily. 30 tablet [place], [place] E, [place]   fluticasone [facility]) 50 MCG/ACT nasal spray  (Status: Discontinued) Place 1 spray into both nostrils daily. 16 g Marlyce Mcdougald, Hildred Alamin E, [place]   phenol (CHLORASEPTIC) 1.4 % LIQD Use as directed 1 spray in the mouth or throat as needed for throat irritation / pain. 118 mL Sheriann Newmann, Hildred Alamin E, FNP   fluticasone [facility]) 50 MCG/ACT nasal spray Place 1 spray into both nostrils daily. 16 g Teodora Medici, [place]      PDMP not reviewed this encounter.   Bertie, Rao, [place] 12/24/21 1114

## 2021-12-27 LAB — CULTURE, GROUP A STREP (THRC)

## 2022-05-19 ENCOUNTER — Ambulatory Visit
Admission: EM | Admit: 2022-05-19 | Discharge: 2022-05-19 | Disposition: A | Discharge status: Home / Self Care | Payer: No Typology Code available for payment source | Attending: Internal Medicine | Admitting: Internal Medicine

## 2022-05-19 DIAGNOSIS — J069 Acute upper respiratory infection, unspecified: Secondary | ICD-10-CM | POA: Diagnosis not present

## 2022-05-19 MED ORDER — BENZONATATE 100 MG PO CAPS
100.0000 mg | ORAL_CAPSULE | Freq: Three times a day (TID) | ORAL | 0 refills | Status: DC | PRN
Start: 2022-05-19 — End: 2022-05-31

## 2022-05-19 MED ORDER — FLUTICASONE PROPIONATE 50 MCG/ACT NA SUSP: 1.0000 | Freq: Every day | NASAL | 0 refills | Status: AC

## 2022-05-19 NOTE — ED Triage Notes (Signed)
Pt states cough,body aches,headache and chills for the past 3 days. Hs been taking OTC cold medicine at home.

## 2022-05-19 NOTE — ED Provider Notes (Signed)
[facility]    CSN: [id number] Arrival date & time: 05/19/22  0946      History   Chief Complaint Chief Complaint  Patient presents with   Cough    HPI Kyle Guzman is a 52 y.o. male.   Patient presents with a 3-day history of cough, nasal congestion, body aches, headache, chills.  Patient reports that he felt feverish with sweats but did not take temperature with thermometer.  Has been taking over-the-counter cold and flu medication with minimal and temporary improvement in symptoms.  Denies sore throat, chest pain, shortness of breath, gastrointestinal symptoms.  Denies history of asthma or COPD and patient does not smoke cigarettes.   Cough   Past Medical History:  Diagnosis Date   Pemphigus    PNA (pneumonia)     Patient Active Problem List   Diagnosis Date Noted   Nuclear sclerotic cataract of both eyes 08/25/2021   Pemphigus 08/25/2021   Acute central serous retinopathy with subretinal fluid, right 10/01/2020    History reviewed. No pertinent surgical history.     Home Medications    Prior to Admission medications   Medication Sig Start Date End Date Taking? Authorizing Provider  benzonatate (TESSALON) 100 MG capsule Take 1 capsule (100 mg total) by mouth every 8 (eight) hours as needed for cough. 05/19/22  Yes Shandy Vi, Rolly Salter E, FNP  fluticasone (FLONASE) 50 MCG/ACT nasal spray Place 1 spray into both nostrils daily. 05/19/22  Yes Laylonie Marzec, Rolly Salter E, FNP  Calcium Carb-Cholecalciferol (CALCIUM 600/VITAMIN D PO) Take by mouth. Take 2 tablets daily    [provider]  cetirizine (ZYRTEC) 10 MG tablet Take 1 tablet (10 mg total) by mouth daily. 12/24/21   Gustavus Bryant, FNP  Cholecalciferol 125 MCG (5000 UT) TABS Take by mouth.    [provider]  Cyanocobalamin (B-12) 500 MCG TABS Take by mouth.    [provider]  magic mouthwash (nystatin, lidocaine, diphenhydrAMINE, alum & mag hydroxide) suspension Swish and swallow 5 mLs 3 (three)  times daily as needed for mouth pain. 04/07/20   Wieters, Hallie C, PA-C  methylPREDNISolone (MEDROL) 4 MG tablet Take by mouth daily. 04/10/21   [provider]  mupirocin ointment (BACTROBAN) 2 % Apply 1 application topically 2 (two) times daily. To skin sores 04/07/20   Wieters, Hallie C, PA-C  mycophenolate (CELLCEPT) 500 MG tablet Take 1 tablet by mouth daily. 01/02/21   [provider]  phenol (CHLORASEPTIC) 1.4 % LIQD Use as directed 1 spray in the mouth or throat as needed for throat irritation / pain. 12/24/21   Gustavus Bryant, FNP    Family History Family History  Problem Relation Age of Onset   Healthy Mother     Social History Social History   Tobacco Use   Smoking status: Former   Smokeless tobacco: Former    Quit date: 10/27/2010  Vaping Use   Vaping Use: Never used  Substance Use Topics   Alcohol use: No   Drug use: No     Allergies   Patient has no known allergies.   Review of Systems Review of Systems Per HPI  Physical Exam Triage Vital Signs ED Triage Vitals  Enc Vitals Group     BP 05/19/22 1028 116/78     Pulse Rate 05/19/22 1028 79     Resp 05/19/22 1028 16     Temp 05/19/22 1028 98 F (36.7 C)     Temp Source 05/19/22 1028 Oral  SpO2 05/19/22 1028 96 %     Weight --      Height --      Head Circumference --      Peak Flow --      Pain Score 05/19/22 1030 0     Pain Loc --      Pain Edu? --      Excl. in GC? --    No data found.  Updated Vital Signs BP 116/78 (BP Location: Right Arm)   Pulse 79   Temp 98 F (36.7 C) (Oral)   Resp 16   SpO2 96%   Visual Acuity Right Eye Distance:   Left Eye Distance:   Bilateral Distance:    Right Eye Near:   Left Eye Near:    Bilateral Near:     Physical Exam Constitutional:      General: He is not in acute distress.    Appearance: Normal appearance. He is not toxic-appearing or diaphoretic.  HENT:     Head: Normocephalic and atraumatic.     Right Ear: Tympanic  membrane and ear canal normal.     Left Ear: Tympanic membrane and ear canal normal.     Nose: Congestion present.     Mouth/Throat:     Mouth: Mucous membranes are moist.     Pharynx: Posterior oropharyngeal erythema present.  Eyes:     Extraocular Movements: Extraocular movements intact.     Conjunctiva/sclera: Conjunctivae normal.     Pupils: Pupils are equal, round, and reactive to light.  Cardiovascular:     Rate and Rhythm: Normal rate and regular rhythm.     Pulses: Normal pulses.     Heart sounds: Normal heart sounds.  Pulmonary:     Effort: Pulmonary effort is normal. No respiratory distress.     Breath sounds: Normal breath sounds. No stridor. No wheezing, rhonchi or rales.  Abdominal:     General: Abdomen is flat. Bowel sounds are normal.     Palpations: Abdomen is soft.  Musculoskeletal:        General: Normal range of motion.     Cervical back: Normal range of motion.  Skin:    General: Skin is warm and dry.  Neurological:     General: No focal deficit present.     Mental Status: He is alert and oriented to person, place, and time. Mental status is at baseline.  Psychiatric:        Mood and Affect: Mood normal.        Behavior: Behavior normal.      UC Treatments / Results  Labs (all labs ordered are listed, but only abnormal results are displayed) Labs Reviewed - No data to display  EKG   Radiology No results found.  Procedures Procedures (including critical care time)  Medications Ordered in UC Medications - No data to display  Initial Impression / Assessment and Plan / UC Course  I have reviewed the triage vital signs and the nursing notes.  Pertinent labs & imaging results that were available during my care of the patient were reviewed by me and considered in my medical decision making (see chart for details).     Patient presents with symptoms likely from a viral upper respiratory infection. Do not suspect underlying cardiopulmonary  process. Symptoms seem unlikely related to ACS, CHF or COPD exacerbations, pneumonia, pneumothorax. Patient is nontoxic appearing and not in need of emergent medical intervention.  Patient declined COVID testing.  Recommended symptom control with medications  and supportive care.  Patient was sent benzonatate and Flonase.  Patient denies that he takes any nasal sprays daily so that should be safe.  Return if symptoms fail to improve in 1-2 weeks or you develop shortness of breath, chest pain, severe headache. Patient states understanding and is agreeable.  Discharged with PCP followup.  Final Clinical Impressions(s) / UC Diagnoses   Final diagnoses:  Viral upper respiratory tract infection with cough     Discharge Instructions      As we discussed, it appears that you have a viral illness that should run its course.  I have prescribed you 2 medications to help alleviate symptoms.  Ensure adequate fluid hydration and rest.  Follow-up if any symptoms persist or worsen.    ED Prescriptions     Medication Sig Dispense Auth. Provider   benzonatate (TESSALON) 100 MG capsule Take 1 capsule (100 mg total) by mouth every 8 (eight) hours as needed for cough. 21 capsule [place], [place], [place]   fluticasone [facility]) 50 MCG/ACT nasal spray Place 1 spray into both nostrils daily. 16 g Gustavus Bryant, [place]      PDMP not reviewed this encounter.   [place], [place], [place] 05/19/22 1055

## 2022-05-19 NOTE — Discharge Instructions (Signed)
As we discussed, it appears that you have a viral illness that should run its course.  I have prescribed you 2 medications to help alleviate symptoms.  Ensure adequate fluid hydration and rest.  Follow-up if any symptoms persist or worsen.

## 2022-05-31 ENCOUNTER — Ambulatory Visit (HOSPITAL_BASED_OUTPATIENT_CLINIC_OR_DEPARTMENT_OTHER)
Admission: RE | Admit: 2022-05-31 | Discharge: 2022-05-31 | Disposition: A | Discharge status: Home / Self Care | Payer: No Typology Code available for payment source | Source: Ambulatory Visit | Attending: Internal Medicine | Admitting: Internal Medicine

## 2022-05-31 ENCOUNTER — Encounter: Payer: Self-pay | Admitting: Emergency Medicine

## 2022-05-31 ENCOUNTER — Emergency Department (HOSPITAL_BASED_OUTPATIENT_CLINIC_OR_DEPARTMENT_OTHER)
Admission: EM | Admit: 2022-05-31 | Discharge: 2022-05-31 | Discharge status: Absent without leave | Payer: No Typology Code available for payment source | Source: Home / Self Care

## 2022-05-31 ENCOUNTER — Ambulatory Visit
Admission: EM | Admit: 2022-05-31 | Discharge: 2022-05-31 | Disposition: A | Discharge status: Home / Self Care | Payer: No Typology Code available for payment source | Attending: Internal Medicine | Admitting: Internal Medicine

## 2022-05-31 DIAGNOSIS — R918 Other nonspecific abnormal finding of lung field: Secondary | ICD-10-CM | POA: Insufficient documentation

## 2022-05-31 DIAGNOSIS — J209 Acute bronchitis, unspecified: Secondary | ICD-10-CM | POA: Diagnosis not present

## 2022-05-31 DIAGNOSIS — R059 Cough, unspecified: Secondary | ICD-10-CM | POA: Diagnosis present

## 2022-05-31 MED ORDER — DOXYCYCLINE HYCLATE 100 MG PO CAPS
100.0000 mg | ORAL_CAPSULE | Freq: Two times a day (BID) | ORAL | 0 refills | Status: AC
Start: 2022-05-31 — End: 2022-06-05

## 2022-05-31 MED ORDER — PREDNISONE 20 MG PO TABS: 40.0000 mg | ORAL_TABLET | Freq: Every day | ORAL | 0 refills | Status: AC

## 2022-05-31 MED ORDER — HYDROCOD POLI-CHLORPHE POLI ER 10-8 MG/5ML PO SUER: 5.0000 mL | Freq: Two times a day (BID) | ORAL | 0 refills | Status: DC | PRN

## 2022-05-31 NOTE — Discharge Instructions (Addendum)
Maintain adequate hydration Please take medications as prescribed Please go to [facility] for a chest x-ray We will call you with recommendations if x-ray is abnormal Return to urgent care if you have worsening symptoms.

## 2022-05-31 NOTE — ED Triage Notes (Signed)
Pt reports a cough, back pain when coughing, and dark mucous production. States he was seen on 4/30 and given a nasal spray and medicine for cough. Has been feeling worse. Requesting a chest xray.

## 2022-05-31 NOTE — ED Provider Notes (Signed)
[facility]    CSN: [id number] Arrival date & time: 05/31/22  8841      History   Chief Complaint Chief Complaint  Patient presents with   Cough    HPI Kyle Guzman is a 52 y.o. male was to urgent care with worsening cough and chest wall pain with coughing.  Patient was seen about 2 weeks ago for viral URI and treated symptomatically.  Patient says the cough has worsened and he started having productive cough over the past several days.  Cough is productive of yellowish and thick sputum.  He denies any fever or chills.  He endorses some shortness of breath and wheezing.  No nausea, vomiting or diarrhea.  No dizziness, near syncope or syncopal episodes.  No blood in the sputum.  Patient is never a smoker.  He denies any history of asthma or inhaler use. HPI  Past Medical History:  Diagnosis Date   Pemphigus    PNA (pneumonia)     Patient Active Problem List   Diagnosis Date Noted   Nuclear sclerotic cataract of both eyes 08/25/2021   Pemphigus 08/25/2021   Acute central serous retinopathy with subretinal fluid, right 10/01/2020    History reviewed. No pertinent surgical history.     Home Medications    Prior to Admission medications   Medication Sig Start Date End Date Taking? Authorizing Provider  chlorpheniramine-HYDROcodone (TUSSIONEX) 10-8 MG/5ML Take 5 mLs by mouth every 12 (twelve) hours as needed for cough. 05/31/22  Yes Quinnten Calvin, Britta Mccreedy, MD  doxycycline (VIBRAMYCIN) 100 MG capsule Take 1 capsule (100 mg total) by mouth 2 (two) times daily for 5 days. 05/31/22 06/05/22 Yes Lela Gell, Britta Mccreedy, MD  predniSONE (DELTASONE) 20 MG tablet Take 2 tablets (40 mg total) by mouth daily for 5 days. 05/31/22 06/05/22 Yes Guyla Bless, Britta Mccreedy, MD  Calcium Carb-Cholecalciferol (CALCIUM 600/VITAMIN D PO) Take by mouth. Take 2 tablets daily    [provider]  cetirizine (ZYRTEC) 10 MG tablet Take 1 tablet (10 mg total) by mouth daily. 12/24/21   Gustavus Bryant, FNP   Cholecalciferol 125 MCG (5000 UT) TABS Take by mouth.    [provider]  Cyanocobalamin (B-12) 500 MCG TABS Take by mouth.    [provider]  fluticasone (FLONASE) 50 MCG/ACT nasal spray Place 1 spray into both nostrils daily. 05/19/22   Gustavus Bryant, FNP  magic mouthwash (nystatin, lidocaine, diphenhydrAMINE, alum & mag hydroxide) suspension Swish and swallow 5 mLs 3 (three) times daily as needed for mouth pain. 04/07/20   Wieters, Hallie C, PA-C  mupirocin ointment (BACTROBAN) 2 % Apply 1 application topically 2 (two) times daily. To skin sores 04/07/20   Wieters, Hallie C, PA-C  mycophenolate (CELLCEPT) 500 MG tablet Take 1 tablet by mouth daily. 01/02/21   [provider]  phenol (CHLORASEPTIC) 1.4 % LIQD Use as directed 1 spray in the mouth or throat as needed for throat irritation / pain. 12/24/21   Gustavus Bryant, FNP    Family History Family History  Problem Relation Age of Onset   Healthy Mother     Social History Social History   Tobacco Use   Smoking status: Former   Smokeless tobacco: Former    Quit date: 10/27/2010  Vaping Use   Vaping Use: Never used  Substance Use Topics   Alcohol use: No   Drug use: No     Allergies   Patient has no known allergies.   Review of Systems Review  of Systems As per HPI  Physical Exam Triage Vital Signs ED Triage Vitals [05/31/22 0834]  Enc Vitals Group     BP 102/67     Pulse Rate 84     Resp 17     Temp 97.9 F (36.6 C)     Temp Source Oral     SpO2 96 %     Weight      Height      Head Circumference      Peak Flow      Pain Score 0     Pain Loc      Pain Edu?      Excl. in GC?    No data found.  Updated Vital Signs BP 102/67 (BP Location: Left Arm)   Pulse 84   Temp 97.9 F (36.6 C) (Oral)   Resp 17   SpO2 96%   Visual Acuity Right Eye Distance:   Left Eye Distance:   Bilateral Distance:    Right Eye Near:   Left Eye Near:    Bilateral Near:     Physical Exam Vitals  and nursing note reviewed.  Constitutional:      General: He is not in acute distress.    Appearance: He is not ill-appearing.  HENT:     Nose: Nose normal.  Cardiovascular:     Rate and Rhythm: Normal rate and regular rhythm.     Pulses: Normal pulses.     Heart sounds: Normal heart sounds.  Pulmonary:     Effort: Pulmonary effort is normal.     Breath sounds: Rales present. No wheezing or rhonchi.  Abdominal:     General: Bowel sounds are normal.     Palpations: Abdomen is soft.  Neurological:     Mental Status: He is alert.      UC Treatments / Results  Labs (all labs ordered are listed, but only abnormal results are displayed) Labs Reviewed - No data to display  EKG   Radiology No results found.  Procedures Procedures (including critical care time)  Medications Ordered in UC Medications - No data to display  Initial Impression / Assessment and Plan / UC Course  I have reviewed the triage vital signs and the nursing notes.  Pertinent labs & imaging results that were available during my care of the patient were reviewed by me and considered in my medical decision making (see chart for details).     1.  Acute bronchitis with bronchospasm: Chest x-ray ordered to be done in the med [facility] Doxycycline 100 mg twice daily for 5 days Prednisone 40 mg orally daily for 5 days Tussionex twice daily as needed for cough ([employer] as an outpatient effective in controlling his cough.) Return precautions given Final Clinical Impressions(s) / UC Diagnoses   Final diagnoses:  Acute bronchitis with bronchospasm     Discharge Instructions      Maintain adequate hydration Please take medications as prescribed Please go to [facility] for a chest x-ray We will call you with recommendations if x-ray is abnormal Return to urgent care if you have worsening symptoms.    ED Prescriptions     Medication Sig Dispense Auth. Provider    chlorpheniramine-HYDROcodone (TUSSIONEX) 10-8 MG/5ML Take 5 mLs by mouth every 12 (twelve) hours as needed for cough. 115 mL Gleen Ripberger, Britta Mccreedy, MD   predniSONE (DELTASONE) 20 MG tablet Take 2 tablets (40 mg total) by mouth daily for 5 days. 10 tablet Demaris Callander  O, MD   doxycycline (VIBRAMYCIN) 100 MG capsule Take 1 capsule (100 mg total) by mouth 2 (two) times daily for 5 days. 10 capsule Mckinleigh Schuchart, Britta Mccreedy, MD      I have reviewed the PDMP during this encounter.   Merrilee Jansky, MD 05/31/22 [phone]

## 2022-06-12 ENCOUNTER — Ambulatory Visit
Admission: EM | Admit: 2022-06-12 | Discharge: 2022-06-12 | Disposition: A | Discharge status: Home / Self Care | Payer: No Typology Code available for payment source

## 2022-06-12 ENCOUNTER — Ambulatory Visit (INDEPENDENT_AMBULATORY_CARE_PROVIDER_SITE_OTHER): Payer: No Typology Code available for payment source

## 2022-06-12 DIAGNOSIS — J189 Pneumonia, unspecified organism: Secondary | ICD-10-CM

## 2022-06-12 DIAGNOSIS — R058 Other specified cough: Secondary | ICD-10-CM

## 2022-06-12 MED ORDER — PREDNISONE 20 MG PO TABS: 40.0000 mg | ORAL_TABLET | Freq: Every day | ORAL | 0 refills | Status: DC

## 2022-06-12 MED ORDER — LEVOFLOXACIN 500 MG PO TABS: 500.0000 mg | ORAL_TABLET | Freq: Every day | ORAL | 0 refills | Status: DC

## 2022-06-12 NOTE — ED Triage Notes (Signed)
Pt presents with cough, night sweats that started 05/19/22, went to doctor 5/12 and was dx with pneumonia and took prescribed medication now still having cough and night sweats, his provider would like a follow up x-ray to see if pneumonia is progressing or better.

## 2022-06-12 NOTE — Discharge Instructions (Signed)
Complete and course of medication.  I still would like for you to follow-up with your primary care doctor within 4 to 6 weeks as you have had to abnormal chest x-rays and you need to have a CT of the chest to ensure that all infections seen on your right lung has completely resolved.  If you develop any worsening breathing, fever or any other worsening of symptoms while taking this medication go immediately to the emergency department.

## 2022-06-12 NOTE — ED Provider Notes (Signed)
[facility]    CSN: [id number] Arrival date & time: 06/12/22  7829      History   Chief Complaint Chief Complaint  Patient presents with   Cough    HPI Kyle Guzman is a 52 y.o. male.   HPI Patient presents today for repeat chest x-ray.  Patient was seen here at [facility] twice, once in 05/19/2022 and 05/31/2022, diagnosed with pneumonia on 05/31/2022.  Patient completed all of his antibiotics however today reports that he still has a persistent productive cough.  He has not had fever.  He was treated with doxycycline.  He reports he is unable to complete a whole sentence without coughing.  He is here for repeat chest x-ray. Past Medical History:  Diagnosis Date   Pemphigus    PNA (pneumonia)     Patient Active Problem List   Diagnosis Date Noted   Nuclear sclerotic cataract of both eyes 08/25/2021   Pemphigus 08/25/2021   Acute central serous retinopathy with subretinal fluid, right 10/01/2020    History reviewed. No pertinent surgical history.     Home Medications    Prior to Admission medications   Medication Sig Start Date End Date Taking? Authorizing Provider  Calcium Carb-Cholecalciferol (CALCIUM 600/VITAMIN D PO) Take by mouth. Take 2 tablets daily   Yes [provider]  chlorpheniramine-HYDROcodone (TUSSIONEX) 10-8 MG/5ML Take 5 mLs by mouth every 12 (twelve) hours as needed for cough. 05/31/22  Yes Lamptey, Britta Mccreedy, MD  levofloxacin (LEVAQUIN) 500 MG tablet Take 1 tablet (500 mg total) by mouth daily. 06/12/22  Yes Bing Neighbors, NP  mycophenolate (CELLCEPT) 500 MG tablet Take 1 tablet by mouth daily. 01/02/21  Yes [provider]  predniSONE (DELTASONE) 20 MG tablet Take 2 tablets (40 mg total) by mouth daily with breakfast. 06/12/22  Yes Bing Neighbors, NP  cetirizine (ZYRTEC) 10 MG tablet Take 1 tablet (10 mg total) by mouth daily. 12/24/21   Gustavus Bryant, FNP  Cholecalciferol 125 MCG (5000 UT) TABS Take by mouth.     [provider]  Cyanocobalamin (B-12) 500 MCG TABS Take by mouth.    [provider]  doxycycline (VIBRAMYCIN) 50 MG capsule Take by mouth. 02/11/22   [provider]  fluticasone (FLONASE) 50 MCG/ACT nasal spray Place 1 spray into both nostrils daily. 05/19/22   Gustavus Bryant, FNP  magic mouthwash (nystatin, lidocaine, diphenhydrAMINE, alum & mag hydroxide) suspension Swish and swallow 5 mLs 3 (three) times daily as needed for mouth pain. 04/07/20   Wieters, Hallie C, PA-C  mupirocin ointment (BACTROBAN) 2 % Apply 1 application topically 2 (two) times daily. To skin sores 04/07/20   Wieters, Hallie C, PA-C  phenol (CHLORASEPTIC) 1.4 % LIQD Use as directed 1 spray in the mouth or throat as needed for throat irritation / pain. 12/24/21   Gustavus Bryant, FNP    Family History Family History  Problem Relation Age of Onset   Healthy Mother     Social History Social History   Tobacco Use   Smoking status: Former   Smokeless tobacco: Former    Quit date: 10/27/2010  Vaping Use   Vaping Use: Never used  Substance Use Topics   Alcohol use: No   Drug use: No     Allergies   Patient has no known allergies.   Review of Systems Review of Systems Pertinent negatives listed in HPI   Physical Exam Triage Vital Signs ED Triage Vitals [06/12/22 0846]  Enc Vitals Group     BP 137/89     Pulse Rate (!) 59     Resp 18     Temp 97.7 F (36.5 C)     Temp Source Oral     SpO2 98 %     Weight      Height      Head Circumference      Peak Flow      Pain Score 0     Pain Loc      Pain Edu?      Excl. in GC?    No data found.  Updated Vital Signs BP 137/89 (BP Location: Right Arm)   Pulse (!) 59   Temp 97.7 F (36.5 C) (Oral)   Resp 18   SpO2 98%   Visual Acuity Right Eye Distance:   Left Eye Distance:   Bilateral Distance:    Right Eye Near:   Left Eye Near:    Bilateral Near:     Physical Exam   UC Treatments / Results  Labs (all labs  ordered are listed, but only abnormal results are displayed) Labs Reviewed - No data to display  EKG   Radiology DG Chest 2 View  Result Date: 06/12/2022 CLINICAL DATA:  Productive cough. EXAM: CHEST - 2 VIEW COMPARISON:  May 31, 2022. FINDINGS: The heart size and mediastinal contours are within normal limits. Left lung is clear. Grossly stable opacity seen involving right upper lobe with adjacent thickening of the right minor fissure which may represent pneumonia or postinfectious scarring. The visualized skeletal structures are unremarkable. IMPRESSION: Grossly stable right upper lobe opacity is noted with adjacent thickening of right minor fissure most consistent with pneumonia or postinfectious scarring. Followup PA and lateral chest X-ray is recommended in 3-4 weeks following trial of antibiotic therapy to ensure resolution and exclude underlying malignancy. Electronically Signed   By: Lupita Raider M.D.   On: 06/12/2022 09:41    Procedures Procedures (including critical care time)  Medications Ordered in UC Medications - No data to display  Initial Impression / Assessment and Plan / UC Course  I have reviewed the triage vital signs and the nursing notes.  Pertinent labs & imaging results that were available during my care of the patient were reviewed by me and considered in my medical decision making (see chart for details).     Imaging of the chest continues to show pneumonia involving the right upper lobe.  Will trial an extended course of Levaquin and prednisone.  If this does not resolve symptoms would recommend a CT of the chest as this is already been ongoing for several weeks not resolution.  Discussed with patient to follow-up with his primary care provider to schedule a follow-up evaluation of abnormal chest x-rays as he will likely need a CT of the chest to rule out any underlying infectious process or malignancy.  Patient verbalized understanding and agreement with  plan Final Clinical Impressions(s) / UC Diagnoses   Final diagnoses:  Pneumonia of right upper lobe due to infectious organism  Cough present for greater than 3 weeks     Discharge Instructions      Complete and course of medication.  I still would like for you to follow-up with your primary care doctor within 4 to 6 weeks as you have had to abnormal chest x-rays and you need to have a CT of the chest to ensure that all infections seen on your right lung has completely  resolved.  If you develop any worsening breathing, fever or any other worsening of symptoms while taking this medication go immediately to the emergency department.   ED Prescriptions     Medication Sig Dispense Auth. Provider   levofloxacin (LEVAQUIN) 500 MG tablet Take 1 tablet (500 mg total) by mouth daily. 10 tablet Bing Neighbors, NP   predniSONE (DELTASONE) 20 MG tablet Take 2 tablets (40 mg total) by mouth daily with breakfast. 10 tablet Bing Neighbors, NP      PDMP not reviewed this encounter.   Bing Neighbors, NP 06/15/22 1001

## 2022-06-22 ENCOUNTER — Ambulatory Visit: Payer: No Typology Code available for payment source | Admitting: Neurology

## 2022-07-07 ENCOUNTER — Other Ambulatory Visit: Payer: Self-pay

## 2022-07-07 ENCOUNTER — Emergency Department (HOSPITAL_COMMUNITY)
Admission: EM | Admit: 2022-07-07 | Discharge: 2022-07-07 | Disposition: A | Discharge status: Home / Self Care | Payer: No Typology Code available for payment source | Attending: Emergency Medicine | Admitting: Emergency Medicine

## 2022-07-07 ENCOUNTER — Other Ambulatory Visit: Payer: Self-pay | Admitting: Nurse Practitioner

## 2022-07-07 ENCOUNTER — Encounter (HOSPITAL_COMMUNITY): Payer: Self-pay

## 2022-07-07 ENCOUNTER — Ambulatory Visit
Admission: RE | Admit: 2022-07-07 | Discharge: 2022-07-07 | Disposition: A | Discharge status: Home / Self Care | Payer: No Typology Code available for payment source | Source: Ambulatory Visit | Attending: Nurse Practitioner | Admitting: Nurse Practitioner

## 2022-07-07 DIAGNOSIS — Z09 Encounter for follow-up examination after completed treatment for conditions other than malignant neoplasm: Secondary | ICD-10-CM

## 2022-07-07 DIAGNOSIS — T782XXA Anaphylactic shock, unspecified, initial encounter: Secondary | ICD-10-CM | POA: Insufficient documentation

## 2022-07-07 DIAGNOSIS — T7840XA Allergy, unspecified, initial encounter: Secondary | ICD-10-CM | POA: Diagnosis present

## 2022-07-07 MED ORDER — FAMOTIDINE 20 MG PO TABS
40.0000 mg | ORAL_TABLET | Freq: Once | ORAL | Status: AC
  Administered 2022-07-07: 40 mg via ORAL
  Filled 2022-07-07: qty 2

## 2022-07-07 MED ORDER — PREDNISONE 20 MG PO TABS: ORAL_TABLET | ORAL | 0 refills | Status: DC

## 2022-07-07 MED ORDER — KETOROLAC TROMETHAMINE 15 MG/ML IJ SOLN
15.0000 mg | Freq: Once | INTRAMUSCULAR | Status: AC
  Administered 2022-07-07: 15 mg via INTRAVENOUS
  Filled 2022-07-07: qty 1

## 2022-07-07 MED ORDER — EPINEPHRINE 0.3 MG/0.3ML IJ SOAJ: 0.3000 mg | INTRAMUSCULAR | 0 refills | Status: AC | PRN

## 2022-07-07 NOTE — ED Triage Notes (Addendum)
Arrived ems from home for allergic reaction, stung by bee hives, some tongue swelling and itching. Pt denies n/v, 50 benadryl, epi .3mg  IM, epi 3mg   2 injections in left deltoid, 20 in L forearm

## 2022-07-07 NOTE — Discharge Instructions (Signed)
I prescribed you 2 to EpiPens.  Please keep 1 with you and 1 where every time you are at the most.  Please if you have a recurrent reaction use the EpiPen if you feel you need to them to make your way to the emergency department for evaluation.  I have prescribed you steroids to take for the next few days.  You can also take an antihistamine this could be Benadryl but that could make you sleepy and so if you want to take a second-generation antihistamine like Claritin Zyrtec Allegra xyzal,  feel free to take that.

## 2022-07-07 NOTE — ED Provider Notes (Signed)
[place] EMERGENCY DEPARTMENT AT [facility] Provider Note   CSN: [id number] Arrival date & time: 07/07/22  1651     History  Chief Complaint  Patient presents with   Allergic Reaction    Kyle Guzman is a 52 y.o. male.  52 yo M with a chief complaints of an allergic reaction.  The patient was stung by a bee and then developed a diffuse rash and felt that his tongue was swelling and he was having difficulty swallowing.  He was given epinephrine Benadryl and Solu-Medrol with EMS.  He was then transported here for evaluation.  He feels a bit better now.  Still has a diffuse rash.  Does not feel like he is having trouble swallowing currently.  Denied difficulty breathing denied nausea vomiting or diarrhea denied feeling like he might pass out.  He had an allergic reaction to a bee sting in the remote past.   Allergic Reaction      Home Medications Prior to Admission medications   Medication Sig Start Date End Date Taking? Authorizing Provider  EPINEPHrine 0.3 mg/0.3 mL IJ SOAJ injection Inject 0.3 mg into the muscle as needed for anaphylaxis. 07/07/22  Yes Melene Plan, DO  predniSONE (DELTASONE) 20 MG tablet 2 tabs po daily x 4 days 07/07/22  Yes Melene Plan, DO  Calcium Carb-Cholecalciferol (CALCIUM 600/VITAMIN D PO) Take by mouth. Take 2 tablets daily    [provider]  cetirizine (ZYRTEC) 10 MG tablet Take 1 tablet (10 mg total) by mouth daily. 12/24/21   Gustavus Bryant, FNP  chlorpheniramine-HYDROcodone (TUSSIONEX) 10-8 MG/5ML Take 5 mLs by mouth every 12 (twelve) hours as needed for cough. 05/31/22   Merrilee Jansky, MD  Cholecalciferol 125 MCG (5000 UT) TABS Take by mouth.    [provider]  Cyanocobalamin (B-12) 500 MCG TABS Take by mouth.    [provider]  doxycycline (VIBRAMYCIN) 50 MG capsule Take by mouth. 02/11/22   [provider]  fluticasone (FLONASE) 50 MCG/ACT nasal spray Place 1 spray into both nostrils daily. 05/19/22    Gustavus Bryant, FNP  levofloxacin (LEVAQUIN) 500 MG tablet Take 1 tablet (500 mg total) by mouth daily. 06/12/22   Bing Neighbors, NP  magic mouthwash (nystatin, lidocaine, diphenhydrAMINE, alum & mag hydroxide) suspension Swish and swallow 5 mLs 3 (three) times daily as needed for mouth pain. 04/07/20   Wieters, Hallie C, PA-C  mupirocin ointment (BACTROBAN) 2 % Apply 1 application topically 2 (two) times daily. To skin sores 04/07/20   Wieters, Hallie C, PA-C  mycophenolate (CELLCEPT) 500 MG tablet Take 1 tablet by mouth daily. 01/02/21   [provider]  phenol (CHLORASEPTIC) 1.4 % LIQD Use as directed 1 spray in the mouth or throat as needed for throat irritation / pain. 12/24/21   Gustavus Bryant, FNP      Allergies    Patient has no known allergies.    Review of Systems   Review of Systems  Physical Exam Updated Vital Signs BP 121/86   Pulse 79   Temp 98 F (36.7 C) (Oral)   Resp 18   SpO2 100%  Physical Exam Vitals and nursing note reviewed.  Constitutional:      Appearance: He is well-developed.  HENT:     Head: Normocephalic and atraumatic.  Eyes:     Pupils: Pupils are equal, round, and reactive to light.  Neck:     Vascular: No JVD.  Cardiovascular:  Rate and Rhythm: Normal rate and regular rhythm.     Heart sounds: No murmur heard.    No friction rub. No gallop.  Pulmonary:     Effort: No respiratory distress.     Breath sounds: No wheezing.  Abdominal:     General: There is no distension.     Tenderness: There is no abdominal tenderness. There is no guarding or rebound.  Musculoskeletal:        General: Normal range of motion.     Cervical back: Normal range of motion and neck supple.  Skin:    Coloration: Skin is not pale.     Findings: No rash.     Comments: Diffuse erythema mostly about the trunk.  Neurological:     Mental Status: He is alert and oriented to person, place, and time.  Psychiatric:        Behavior: Behavior normal.      ED Results / Procedures / Treatments   Labs (all labs ordered are listed, but only abnormal results are displayed) Labs Reviewed - No data to display  EKG None  Radiology No results found.  Procedures .Critical Care  Performed by: Melene Plan, DO Authorized by: Melene Plan, DO   Critical care provider statement:    Critical care time (minutes):  35   Critical care time was exclusive of:  Separately billable procedures and treating other patients   Critical care was time spent personally by me on the following activities:  Development of treatment plan with patient or surrogate, discussions with consultants, evaluation of patient's response to treatment, examination of patient, ordering and review of laboratory studies, ordering and review of radiographic studies, ordering and performing treatments and interventions, pulse oximetry, re-evaluation of patient's condition and review of old charts   Care discussed with: admitting provider       Medications Ordered in ED Medications  famotidine (PEPCID) tablet 40 mg (40 mg Oral Given 07/07/22 1710)  ketorolac (TORADOL) 15 MG/ML injection 15 mg (15 mg Intravenous Given 07/07/22 1939)    ED Course/ Medical Decision Making/ A&P                             Medical Decision Making Risk Prescription drug management.   53 yo M with a chief complaint of an allergic reaction to a bee sting.  Patient is feeling a bit better after epinephrine given by EMS.  Will observe here in the ED.  Given Pepcid added onto the Benadryl and Solu-Medrol that he was given with EMS.  He has no obvious secondary system involvement here in the ED.  Patient continues to improve.  Observed for 4 hours.  Continues the pain of hand pain and swelling and itching.  Swelling is improved significantly.  Will have follow-up with the PCP.  EpiPen prescription.  Course of steroids.  10:09 PM:  I have discussed the diagnosis/risks/treatment options with the patient.   Evaluation and diagnostic testing in the emergency department does not suggest an emergent condition requiring admission or immediate intervention beyond what has been performed at this time.  They will follow up with PCP. We also discussed returning to the ED immediately if new or worsening sx occur. We discussed the sx which are most concerning (e.g., sudden worsening pain, fever, inability to tolerate by mouth) that necessitate immediate return. Medications administered to the patient during their visit and any new prescriptions provided to the patient are listed below.  Medications given during this visit Medications  famotidine (PEPCID) tablet 40 mg (40 mg Oral Given 07/07/22 1710)  ketorolac (TORADOL) 15 MG/ML injection 15 mg (15 mg Intravenous Given 07/07/22 1939)     The patient appears reasonably screen and/or stabilized for discharge and I doubt any other medical condition or other [facility] requiring further screening, evaluation, or treatment in the ED at this time prior to discharge.          Final Clinical Impression(s) / ED Diagnoses Final diagnoses:  Anaphylaxis, initial encounter    Rx / DC Orders ED Discharge Orders          Ordered    predniSONE (DELTASONE) 20 MG tablet        07/07/22 2052    EPINEPHrine 0.3 mg/0.3 mL IJ SOAJ injection  As needed        07/07/22 2052              Melene Plan, DO 07/07/22 2209

## 2022-08-26 ENCOUNTER — Encounter (INDEPENDENT_AMBULATORY_CARE_PROVIDER_SITE_OTHER): Payer: No Typology Code available for payment source | Admitting: Ophthalmology

## 2022-10-26 ENCOUNTER — Ambulatory Visit
Admission: EM | Admit: 2022-10-26 | Discharge: 2022-10-26 | Disposition: A | Discharge status: Home / Self Care | Payer: No Typology Code available for payment source

## 2022-10-26 DIAGNOSIS — J029 Acute pharyngitis, unspecified: Secondary | ICD-10-CM

## 2022-10-26 LAB — POCT RAPID STREP A (OFFICE): Rapid Strep A Screen: NEGATIVE

## 2022-10-26 MED ORDER — AMOXICILLIN 500 MG PO CAPS: 500.0000 mg | ORAL_CAPSULE | Freq: Two times a day (BID) | ORAL | 0 refills | Status: DC

## 2022-10-26 NOTE — ED Triage Notes (Signed)
Started with sore throat about 2 days ago, with no cough". "When I speak or swallow it hurts really bad". "I am noticing some blood in my saliva". "I have a had a Fever too, no known degree, I just know I have one".

## 2022-10-26 NOTE — ED Provider Notes (Signed)
[facility]    CSN: [id number] Arrival date & time: 10/26/22  4098      History   Chief Complaint Chief Complaint  Patient presents with   Sore Throat    HPI Levorn Oleski is a 52 y.o. male.   Patient here today for evaluation of sore throat that started 2 days ago.  He denies any cough or congestion.  He states that when he speaks or swallows he has significant pain.  He thinks he has had fever.  He has taken over-the-counter medication without resolution.  The history is provided by the patient.  Sore Throat Pertinent negatives include no abdominal pain and no shortness of breath.    Past Medical History:  Diagnosis Date   Pemphigus    PNA (pneumonia)     Patient Active Problem List   Diagnosis Date Noted   Nuclear sclerotic cataract of both eyes 08/25/2021   Gastroesophageal reflux disease 11/24/2020   Hoarse 11/24/2020   Acute central serous retinopathy with subretinal fluid, right 10/01/2020   Pemphigus 05/20/2020   Acute tonsillitis 12/27/2019   Throat pain 12/27/2019    History reviewed. No pertinent surgical history.     Home Medications    Prior to Admission medications   Medication Sig Start Date End Date Taking? Authorizing Provider  amoxicillin (AMOXIL) 500 MG capsule Take 1 capsule (500 mg total) by mouth 2 (two) times daily. 10/26/22  Yes Tomi Bamberger, PA-C  doxycycline (VIBRAMYCIN) 50 MG capsule Take 1 capsule by mouth daily. 06/24/22 12/21/22 Yes [provider]  mycophenolate (CELLCEPT) 500 MG tablet Take 1 tablet by mouth daily. 2 in morning and 1 afternoon. 01/02/21  Yes [provider]  acetaminophen (TYLENOL) 500 MG tablet Take 1,000 mg by mouth every 6 (six) hours as needed for mild pain, moderate pain, fever or headache. 06/02/20   [provider]  albuterol (VENTOLIN HFA) 108 (90 Base) MCG/ACT inhaler Inhale 2 puffs into the lungs every 4 (four) hours as needed for wheezing or shortness of breath. 06/27/22    [provider]  Calcium Carb-Cholecalciferol (CALCIUM 600/VITAMIN D PO) Take by mouth. Take 2 tablets daily    [provider]  cetirizine (ZYRTEC) 10 MG tablet Take 1 tablet (10 mg total) by mouth daily. 12/24/21   Gustavus Bryant, FNP  chlorpheniramine-HYDROcodone (TUSSIONEX) 10-8 MG/5ML Take 5 mLs by mouth every 12 (twelve) hours as needed for cough. 05/31/22   Merrilee Jansky, MD  Cholecalciferol 125 MCG (5000 UT) TABS Take by mouth.    [provider]  Cholecalciferol 125 MCG (5000 UT) TABS Take by mouth. 06/02/20   [provider]  clindamycin (CLEOCIN T) 1 % lotion Apply 1 Application topically 2 (two) times daily. 02/11/22   [provider]  clobetasol (TEMOVATE) 0.05 % external solution Apply 1 Application topically 2 (two) times daily. 02/11/22   [provider]  Cyanocobalamin (B-12) 500 MCG TABS Take by mouth.    [provider]  EPINEPHrine 0.3 mg/0.3 mL IJ SOAJ injection Inject 0.3 mg into the muscle as needed for anaphylaxis. 07/07/22   Melene Plan, DO  famotidine (PEPCID) 40 MG tablet Take 40 mg by mouth daily. 11/15/20   [provider]  fluticasone (FLONASE) 50 MCG/ACT nasal spray Place 1 spray into both nostrils daily. 05/19/22   Gustavus Bryant, FNP  ketoconazole (NIZORAL) 2 % shampoo Apply 1 Application topically 2 (two) times a week. 02/11/22   [provider]  magic mouthwash (  nystatin, lidocaine, diphenhydrAMINE, alum & mag hydroxide) suspension Swish and swallow 5 mLs 3 (three) times daily as needed for mouth pain. 04/07/20   Wieters, Hallie C, PA-C  mupirocin ointment (BACTROBAN) 2 % Apply 1 application topically 2 (two) times daily. To skin sores 04/07/20   Wieters, Hallie C, PA-C  mycophenolate (CELLCEPT) 500 MG tablet Take 1,500 mg by mouth 2 (two) times daily. 03/15/22   [provider]  oxyCODONE-acetaminophen (PERCOCET/ROXICET) 5-325 MG tablet Take 1 tablet by mouth as needed. 05/06/20    [provider]  phenol (CHLORASEPTIC) 1.4 % LIQD Use as directed 1 spray in the mouth or throat as needed for throat irritation / pain. 12/24/21   Gustavus Bryant, FNP  predniSONE (DELTASONE) 20 MG tablet 2 tabs po daily x 4 days 07/07/22   Melene Plan, DO  silver sulfADIAZINE (SILVADENE) 1 % cream Apply 1 Application topically daily. 06/10/20   [provider]  triamcinolone cream (KENALOG) 0.1 % Apply 1 Application topically 2 (two) times daily. 10/29/21   [provider]    Family History Family History  Problem Relation Age of Onset   Healthy Mother     Social History Social History   Tobacco Use   Smoking status: Former   Smokeless tobacco: Former    Quit date: 10/27/2010  Vaping Use   Vaping status: Never Used  Substance Use Topics   Alcohol use: No   Drug use: No     Allergies   Patient has no known allergies.   Review of Systems Review of Systems  Constitutional:  Positive for chills and fever.  HENT:  Positive for congestion and sore throat. Negative for ear pain.   Eyes:  Negative for discharge and redness.  Respiratory:  Negative for cough and shortness of breath.   Gastrointestinal:  Negative for abdominal pain, nausea and vomiting.     Physical Exam Triage Vital Signs ED Triage Vitals [10/26/22 0830]  Encounter Vitals Group     BP      Systolic BP Percentile      Diastolic BP Percentile      Pulse      Resp      Temp      Temp src      SpO2      Weight 140 lb (63.5 kg)     Height 5\' 6"  (1.676 m)     Head Circumference      Peak Flow      Pain Score 6     Pain Loc      Pain Education      Exclude from Growth Chart    No data found.  Updated Vital Signs BP 118/76 (BP Location: Left Arm)   Pulse 72   Temp 98 F (36.7 C) (Oral)   Resp 18   Ht 5\' 6"  (1.676 m)   Wt 140 lb (63.5 kg)   BMI 22.60 kg/m      Physical Exam Vitals and nursing note reviewed.  Constitutional:      General: He is not in acute  distress.    Appearance: Normal appearance. He is not ill-appearing.  HENT:     Head: Normocephalic and atraumatic.     Nose: Nose normal. No congestion.     Mouth/Throat:     Mouth: Mucous membranes are moist.     Pharynx: Oropharynx is clear. Posterior oropharyngeal erythema present. No oropharyngeal exudate.  Eyes:     Conjunctiva/sclera: Conjunctivae normal.  Cardiovascular:  Rate and Rhythm: Normal rate and regular rhythm.     Heart sounds: Normal heart sounds. No murmur heard. Pulmonary:     Effort: Pulmonary effort is normal. No respiratory distress.     Breath sounds: Normal breath sounds. No wheezing, rhonchi or rales.  Skin:    General: Skin is warm and dry.  Neurological:     Mental Status: He is alert.  Psychiatric:        Mood and Affect: Mood normal.        Thought Content: Thought content normal.      UC Treatments / Results  Labs (all labs ordered are listed, but only abnormal results are displayed) Labs Reviewed  POCT RAPID STREP A (OFFICE)    EKG   Radiology No results found.  Procedures Procedures (including critical care time)  Medications Ordered in UC Medications - No data to display  Initial Impression / Assessment and Plan / UC Course  I have reviewed the triage vital signs and the nursing notes.  Pertinent labs & imaging results that were available during my care of the patient were reviewed by me and considered in my medical decision making (see chart for details).    Given significant pharyngitis without other symptoms we will treat to cover possible bacterial cause but discussed viral etiology as well.  Encouraged symptomatic treatment, increase fluids and rest with follow-up if no gradual improvement or with any further concerns.  Advised evaluation in the emergency room with any worsening symptoms.  Patient expressed understanding.  Final Clinical Impressions(s) / UC Diagnoses   Final diagnoses:  Acute pharyngitis, unspecified  etiology   Discharge Instructions   None    ED Prescriptions     Medication Sig Dispense Auth. Provider   amoxicillin (AMOXIL) 500 MG capsule Take 1 capsule (500 mg total) by mouth 2 (two) times daily. 20 capsule Tomi Bamberger, PA-C      PDMP not reviewed this encounter.   Tomi Bamberger, PA-C 10/26/22 1056

## 2023-01-21 ENCOUNTER — Ambulatory Visit
Admission: EM | Admit: 2023-01-21 | Discharge: 2023-01-21 | Disposition: A | Discharge status: Home / Self Care | Payer: No Typology Code available for payment source | Attending: Physician Assistant | Admitting: Physician Assistant

## 2023-01-21 ENCOUNTER — Ambulatory Visit (INDEPENDENT_AMBULATORY_CARE_PROVIDER_SITE_OTHER): Payer: No Typology Code available for payment source

## 2023-01-21 DIAGNOSIS — R051 Acute cough: Secondary | ICD-10-CM

## 2023-01-21 DIAGNOSIS — J209 Acute bronchitis, unspecified: Secondary | ICD-10-CM | POA: Diagnosis not present

## 2023-01-21 MED ORDER — DOXYCYCLINE HYCLATE 100 MG PO CAPS: 100.0000 mg | ORAL_CAPSULE | Freq: Two times a day (BID) | ORAL | 0 refills | Status: DC

## 2023-01-21 NOTE — ED Provider Notes (Signed)
 [facility]    CSN: [id number] Arrival date & time: 01/21/23  1449      History   Chief Complaint Chief Complaint  Patient presents with   Cough    HPI Kyle Guzman is a 53 y.o. male.   Patient complains of a cough and congestion.  Patient reports that he has had a cough for several weeks.  Patient reports he has some discomfort in the right side of his chest.  Patient reports he has had nasal congestion and cough.  Patient denies any fever or chills.  Patient has had pneumonia in the past.  He reports feeling worse when he had pneumonia.  Patient denies any exposure to flu or COVID.   Cough   Past Medical History:  Diagnosis Date   Pemphigus    PNA (pneumonia)     Patient Active Problem List   Diagnosis Date Noted   Nuclear sclerotic cataract of both eyes 08/25/2021   Gastroesophageal reflux disease 11/24/2020   Hoarse 11/24/2020   Acute central serous retinopathy with subretinal fluid, right 10/01/2020   Pemphigus 05/20/2020   Acute tonsillitis 12/27/2019   Throat pain 12/27/2019    History reviewed. No pertinent surgical history.     Home Medications    Prior to Admission medications   Medication Sig Start Date End Date Taking? Authorizing Provider  doxycycline  (VIBRAMYCIN ) 100 MG capsule Take 1 capsule (100 mg total) by mouth 2 (two) times daily. 01/21/23  Yes Ryder Chesmore K, PA-C  acetaminophen  (TYLENOL ) 500 MG tablet Take 1,000 mg by mouth every 6 (six) hours as needed for mild pain, moderate pain, fever or headache. 06/02/20   [provider]  albuterol (VENTOLIN HFA) 108 (90 Base) MCG/ACT inhaler Inhale 2 puffs into the lungs every 4 (four) hours as needed for wheezing or shortness of breath. 06/27/22   [provider]  amoxicillin  (AMOXIL ) 500 MG capsule Take 1 capsule (500 mg total) by mouth 2 (two) times daily. 10/26/22   Billy Asberry FALCON, PA-C  Calcium Carb-Cholecalciferol (CALCIUM 600/VITAMIN D PO) Take by mouth. Take 2 tablets  daily    [provider]  cetirizine  (ZYRTEC ) 10 MG tablet Take 1 tablet (10 mg total) by mouth daily. 12/24/21   Hazen Darryle BRAVO, FNP  chlorpheniramine-HYDROcodone (TUSSIONEX) 10-8 MG/5ML Take 5 mLs by mouth every 12 (twelve) hours as needed for cough. 05/31/22   Blaise Aleene KIDD, MD  Cholecalciferol 125 MCG (5000 UT) TABS Take by mouth.    [provider]  Cholecalciferol 125 MCG (5000 UT) TABS Take by mouth. 06/02/20   [provider]  clindamycin (CLEOCIN T) 1 % lotion Apply 1 Application topically 2 (two) times daily. 02/11/22   [provider]  clobetasol (TEMOVATE) 0.05 % external solution Apply 1 Application topically 2 (two) times daily. 02/11/22   [provider]  Cyanocobalamin (B-12) 500 MCG TABS Take by mouth.    [provider]  EPINEPHrine  0.3 mg/0.3 mL IJ SOAJ injection Inject 0.3 mg into the muscle as needed for anaphylaxis. 07/07/22   Emil Share, DO  famotidine  (PEPCID ) 40 MG tablet Take 40 mg by mouth daily. 11/15/20   [provider]  fluticasone  (FLONASE ) 50 MCG/ACT nasal spray Place 1 spray into both nostrils daily. 05/19/22   Hazen Darryle BRAVO, FNP  ketoconazole (NIZORAL) 2 % shampoo Apply 1 Application topically 2 (two) times a week. 02/11/22   [provider]  magic mouthwash (nystatin , lidocaine , diphenhydrAMINE , alum & mag hydroxide) suspension Swish and  swallow 5 mLs 3 (three) times daily as needed for mouth pain. 04/07/20   Wieters, Hallie C, PA-C  mupirocin  ointment (BACTROBAN ) 2 % Apply 1 application topically 2 (two) times daily. To skin sores 04/07/20   Wieters, Hallie C, PA-C  mycophenolate (CELLCEPT) 500 MG tablet Take 1 tablet by mouth daily. 2 in morning and 1 afternoon. 01/02/21   [provider]  mycophenolate (CELLCEPT) 500 MG tablet Take 1,500 mg by mouth 2 (two) times daily. 03/15/22   [provider]  oxyCODONE-acetaminophen  (PERCOCET/ROXICET) 5-325 MG tablet Take 1 tablet by mouth  as needed. 05/06/20   [provider]  phenol (CHLORASEPTIC) 1.4 % LIQD Use as directed 1 spray in the mouth or throat as needed for throat irritation / pain. 12/24/21   Hazen Darryle BRAVO, FNP  predniSONE  (DELTASONE ) 20 MG tablet 2 tabs po daily x 4 days 07/07/22   Floyd, Dan, DO  silver sulfADIAZINE (SILVADENE) 1 % cream Apply 1 Application topically daily. 06/10/20   [provider]  triamcinolone  cream (KENALOG ) 0.1 % Apply 1 Application topically 2 (two) times daily. 10/29/21   [provider]    Family History Family History  Problem Relation Age of Onset   Healthy Mother     Social History Social History   Tobacco Use   Smoking status: Former   Smokeless tobacco: Former    Quit date: 10/27/2010  Vaping Use   Vaping status: Never Used  Substance Use Topics   Alcohol use: No   Drug use: No     Allergies   Patient has no known allergies.   Review of Systems Review of Systems  Respiratory:  Positive for cough.   All other systems reviewed and are negative.    Physical Exam Triage Vital Signs ED Triage Vitals  Encounter Vitals Group     BP 01/21/23 1626 131/81     Systolic BP Percentile --      Diastolic BP Percentile --      Pulse Rate 01/21/23 1626 75     Resp 01/21/23 1626 18     Temp 01/21/23 1626 98.5 F (36.9 C)     Temp Source 01/21/23 1626 Oral     SpO2 01/21/23 1626 97 %     Weight 01/21/23 1624 151 lb 9.6 oz (68.8 kg)     Height 01/21/23 1624 5' 6 (1.676 m)     Head Circumference --      Peak Flow --      Pain Score 01/21/23 1624 8     Pain Loc --      Pain Education --      Exclude from Growth Chart --    No data found.  Updated Vital Signs BP 131/81 (BP Location: Left Arm)   Pulse 75   Temp 98.5 F (36.9 C) (Oral)   Resp 18   Ht 5' 6 (1.676 m)   Wt 68.8 kg   SpO2 97%   BMI 24.47 kg/m   Visual Acuity Right Eye Distance:   Left Eye Distance:   Bilateral Distance:    Right Eye Near:   Left Eye Near:     Bilateral Near:     Physical Exam Vitals and nursing note reviewed.  Constitutional:      Appearance: He is well-developed.  HENT:     Head: Normocephalic.     Right Ear: Tympanic membrane normal.     Left Ear: Tympanic membrane normal.     Nose: Nose normal.  Mouth/Throat:     Mouth: Mucous membranes are moist.  Cardiovascular:     Rate and Rhythm: Normal rate.     Pulses: Normal pulses.  Pulmonary:     Effort: Pulmonary effort is normal.  Abdominal:     General: There is no distension.  Musculoskeletal:        General: Normal range of motion.     Cervical back: Normal range of motion.  Skin:    General: Skin is warm.  Neurological:     Mental Status: He is alert and oriented to person, place, and time.      UC Treatments / Results  Labs (all labs ordered are listed, but only abnormal results are displayed) Labs Reviewed - No data to display  EKG   Radiology DG Chest 2 View Result Date: 01/21/2023 CLINICAL DATA:  Cough. EXAM: CHEST - 2 VIEW COMPARISON:  07/07/2022 FINDINGS: No acute airspace disease. Subsegmental scarring in the periphery of the right mid lung. The heart is normal in size with normal mediastinal contours. No pleural effusion, pulmonary edema, or pneumothorax. No acute osseous findings. IMPRESSION: No acute chest findings. Subsegmental scarring in the periphery of the right mid lung. Electronically Signed   By: Andrea Gasman M.D.   On: 01/21/2023 19:38    Procedures Procedures (including critical care time)  Medications Ordered in UC Medications - No data to display  Initial Impression / Assessment and Plan / UC Course  I have reviewed the triage vital signs and the nursing notes.  Pertinent labs & imaging results that were available during my care of the patient were reviewed by me and considered in my medical decision making (see chart for details).     Chest x-ray ordered reviewed and interpreted chest x-ray shows some increased  markings my read.  Radiologist reports scarring no acute patient given a prescription for doxycycline  he is advised to follow-up with his primary care physician for recheck in 1 week. Final Clinical Impressions(s) / UC Diagnoses   Final diagnoses:  Acute cough  Acute bronchitis, unspecified organism   Discharge Instructions   None    ED Prescriptions     Medication Sig Dispense Auth. Provider   doxycycline  (VIBRAMYCIN ) 100 MG capsule Take 1 capsule (100 mg total) by mouth 2 (two) times daily. 20 capsule Myliyah Rebuck K, PA-C      PDMP not reviewed this encounter.   Nechemia, [place], PA-C 01/21/23 2038

## 2023-01-21 NOTE — ED Triage Notes (Addendum)
 Patient presents with itching of right ear and throat, mucus and right side pain (several weeks). States chest hurts when cough. cough started 3 days ago. Treated with otc Mucinex cold and cough without relief.

## 2023-06-29 IMAGING — CR DG CHEST 2V
2 series · 2 of 2 positions shown · non-contrast
Comparison: None.

CLINICAL DATA: Shortness of breath.

EXAM:
CHEST - 2 VIEW

[w chest pa]
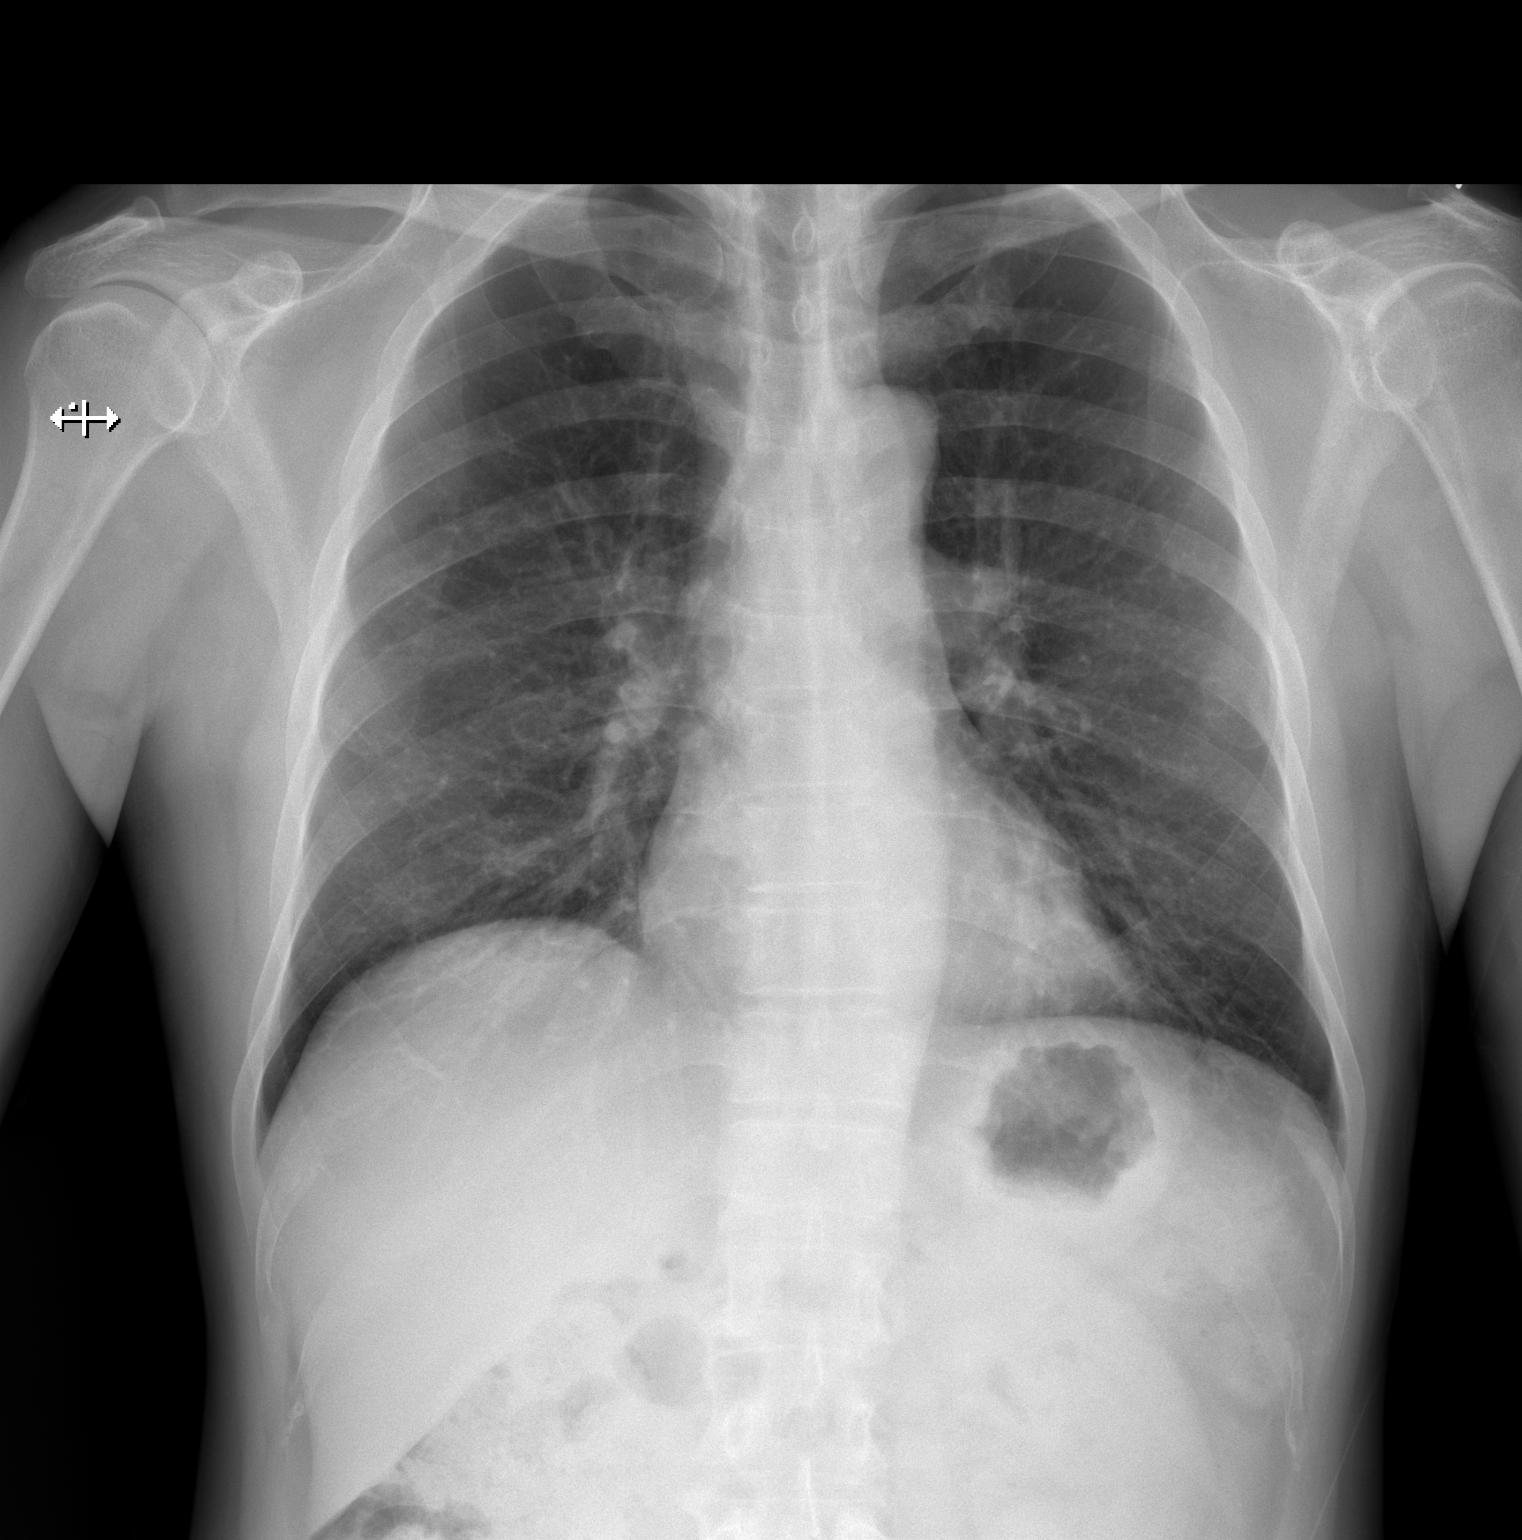

[w chest lat]
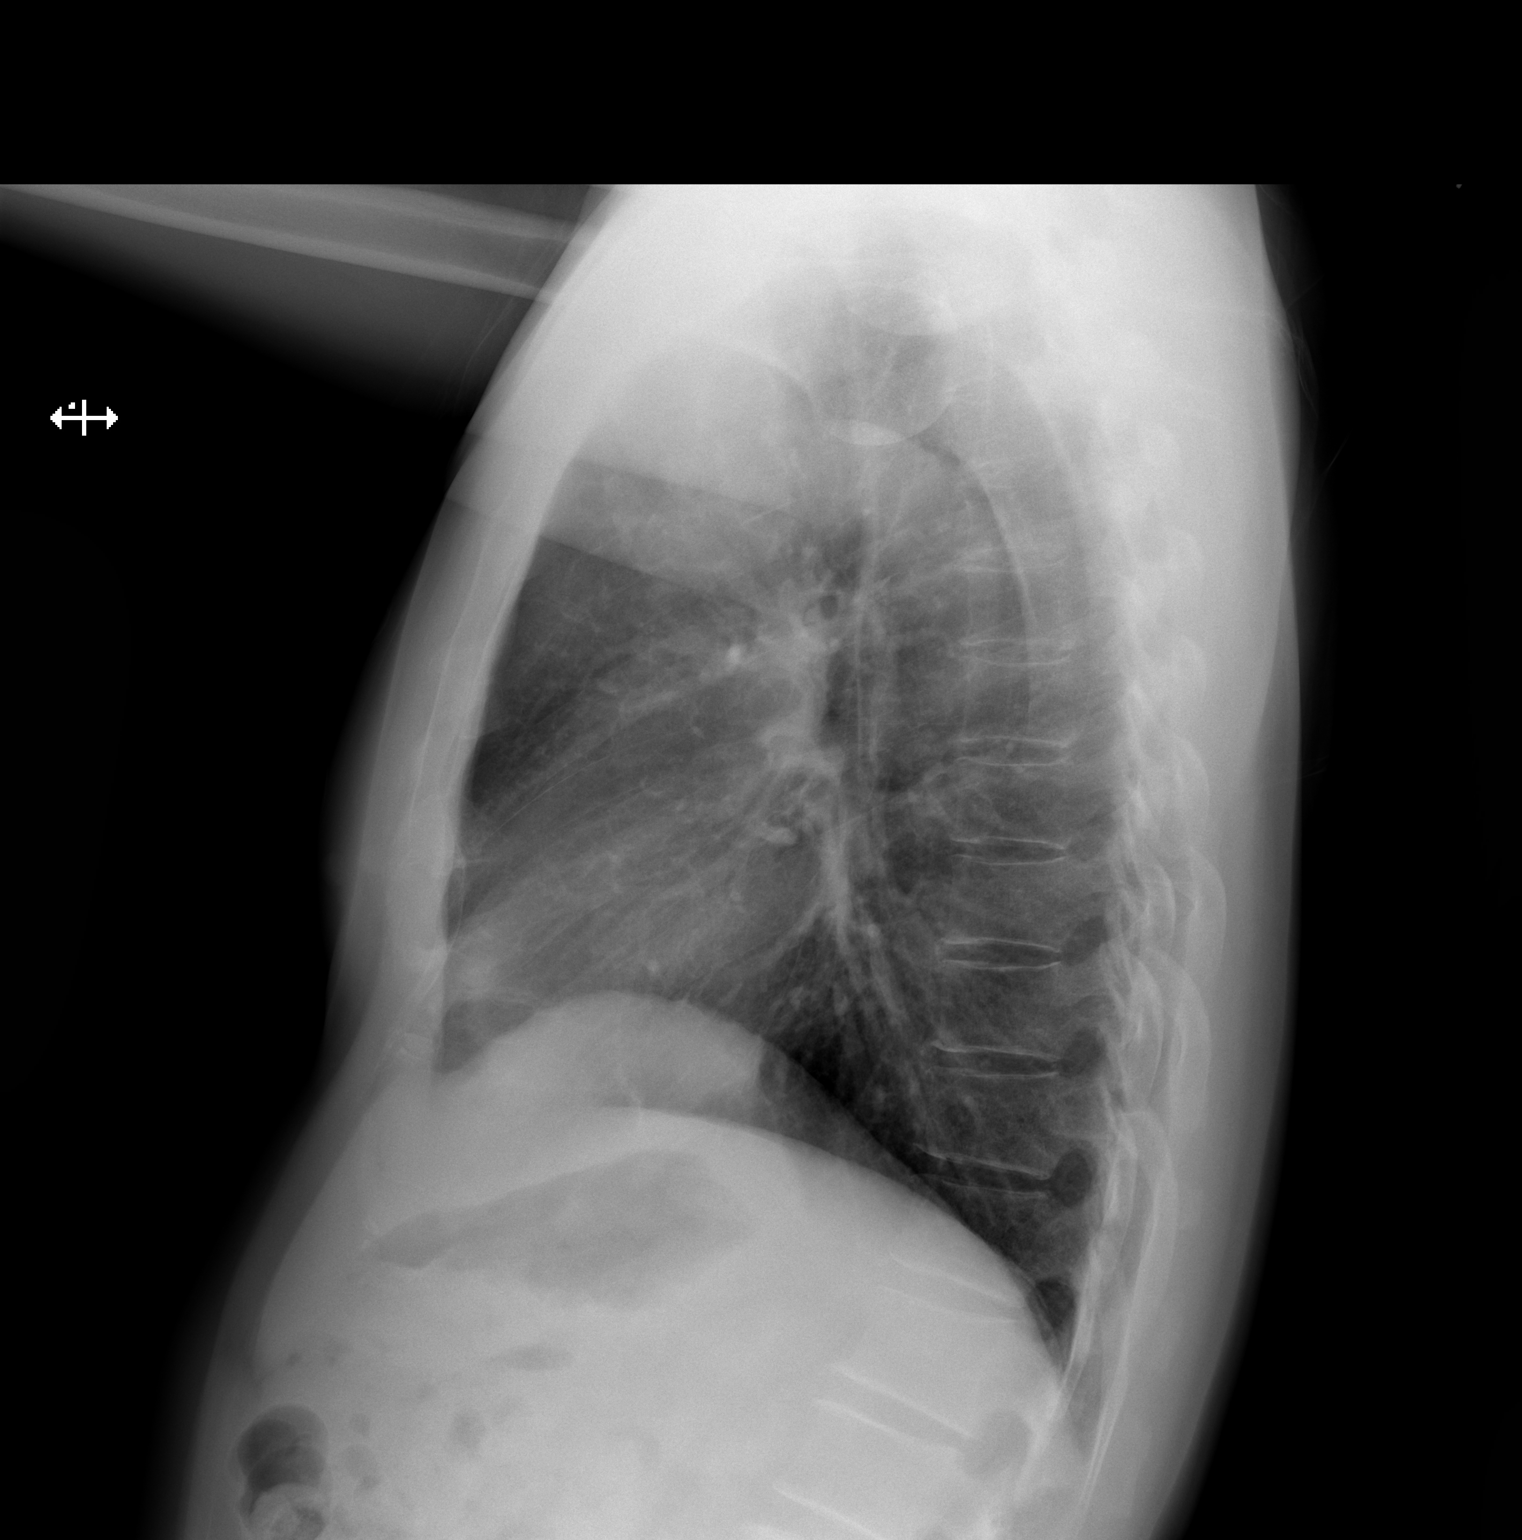

[2 of 2 positions shown; findings below may reference images not displayed]

FINDINGS: Lungs are adequately inflated without effusion. Subtle density over
the lingula anteriorly which may be due to overlapping bony and
vascular structures although could not exclude early airspace
process in this region. Cardiomediastinal silhouette and remainder
of the exam is unremarkable.
IMPRESSION: Subtle density over the lingula which may be due to overlapping bony
and vascular structures versus early airspace process in this
region. Recommend follow-up chest radiograph 3-4 weeks.

## 2023-09-05 ENCOUNTER — Ambulatory Visit (INDEPENDENT_AMBULATORY_CARE_PROVIDER_SITE_OTHER)

## 2023-09-05 ENCOUNTER — Ambulatory Visit: Payer: Self-pay | Admitting: Urgent Care

## 2023-09-05 ENCOUNTER — Ambulatory Visit
Admission: EM | Admit: 2023-09-05 | Discharge: 2023-09-05 | Disposition: A | Discharge status: Home / Self Care | Attending: Family Medicine | Admitting: Family Medicine

## 2023-09-05 DIAGNOSIS — J988 Other specified respiratory disorders: Secondary | ICD-10-CM

## 2023-09-05 DIAGNOSIS — D849 Immunodeficiency, unspecified: Secondary | ICD-10-CM | POA: Diagnosis not present

## 2023-09-05 DIAGNOSIS — R0989 Other specified symptoms and signs involving the circulatory and respiratory systems: Secondary | ICD-10-CM

## 2023-09-05 DIAGNOSIS — B9789 Other viral agents as the cause of diseases classified elsewhere: Secondary | ICD-10-CM | POA: Diagnosis not present

## 2023-09-05 MED ORDER — CETIRIZINE HCL 10 MG PO TABS: 10.0000 mg | ORAL_TABLET | Freq: Every day | ORAL | 0 refills | Status: AC

## 2023-09-05 MED ORDER — PREDNISONE 20 MG PO TABS: ORAL_TABLET | ORAL | 0 refills | Status: DC

## 2023-09-05 MED ORDER — PROMETHAZINE-DM 6.25-15 MG/5ML PO SYRP: 5.0000 mL | ORAL_SOLUTION | Freq: Three times a day (TID) | ORAL | 0 refills | Status: AC | PRN

## 2023-09-05 NOTE — ED Triage Notes (Signed)
 Pt c/o sore throat, prod cough, bilat earache, fever x 2 days-last dose tylenol  8am-NAD-steady gait

## 2023-09-05 NOTE — Discharge Instructions (Signed)
 Will update you with your chest x-ray results later today.  We will manage this as a viral respiratory infection. For sore throat or cough try using a honey-based tea. Use 3 teaspoons of honey with juice squeezed from half lemon. Place shaved pieces of ginger into 1/2-1 cup of water and warm over stove top. Then mix the ingredients and repeat every 4 hours as needed. Please take Tylenol  500mg -650mg  once every 6 hours for fevers, aches and pains. Hydrate very well with at least 2 liters (64 ounces) of water. Eat light meals such as soups (chicken and noodles, chicken wild rice, vegetable).  Do not eat any foods that you are allergic to.  Start an antihistamine like Zyrtec  (10mg  daily) for postnasal drainage, sinus congestion.  You can take this together with prednisone  to help with your chest congestion and severe congestion, respiratory symptoms.  Use the cough syrup as needed for cough.  This can make you sleepy so if you experience this side effect then just take the cough syrup at bedtime.

## 2023-09-05 NOTE — ED Provider Notes (Signed)
 [facility]  Note:  This document was prepared using Conservation officer, historic buildings and may include unintentional dictation errors.  MRN: [id number] DOB: [birth year]  Subjective:   Kyle Guzman is a 53 y.o. male presenting for 2-day history of persistent fever, productive cough, chest congestion, bilateral ear fullness and pain, throat pain.  No chest pain, shortness of breath or wheezing.  No history of asthma.  Patient does have a history of immunosuppression, takes CellCept daily for pemphigus.  Patient quit smoking many years ago.  No current facility-administered medications for this encounter.  Current Outpatient Medications:    acetaminophen  (TYLENOL ) 500 MG tablet, Take 1,000 mg by mouth every 6 (six) hours as needed for mild pain, moderate pain, fever or headache., Disp: , Rfl:    albuterol (VENTOLIN HFA) 108 (90 Base) MCG/ACT inhaler, Inhale 2 puffs into the lungs every 4 (four) hours as needed for wheezing or shortness of breath., Disp: , Rfl:    Calcium Carb-Cholecalciferol (CALCIUM 600/VITAMIN D PO), Take by mouth. Take 2 tablets daily, Disp: , Rfl:    cetirizine  (ZYRTEC ) 10 MG tablet, Take 1 tablet (10 mg total) by mouth daily., Disp: 30 tablet, Rfl: 0   chlorpheniramine-HYDROcodone (TUSSIONEX) 10-8 MG/5ML, Take 5 mLs by mouth every 12 (twelve) hours as needed for cough., Disp: 115 mL, Rfl: 0   Cholecalciferol 125 MCG (5000 UT) TABS, Take by mouth., Disp: , Rfl:    Cholecalciferol 125 MCG (5000 UT) TABS, Take by mouth., Disp: , Rfl:    clindamycin (CLEOCIN T) 1 % lotion, Apply 1 Application topically 2 (two) times daily., Disp: , Rfl:    clobetasol (TEMOVATE) 0.05 % external solution, Apply 1 Application topically 2 (two) times daily., Disp: , Rfl:    Cyanocobalamin (B-12) 500 MCG TABS, Take by mouth., Disp: , Rfl:    doxycycline  (VIBRAMYCIN ) 100 MG capsule, Take 1 capsule (100 mg total) by mouth 2 (two) times daily., Disp: 20 capsule, Rfl: 0   EPINEPHrine   0.3 mg/0.3 mL IJ SOAJ injection, Inject 0.3 mg into the muscle as needed for anaphylaxis., Disp: 2 each, Rfl: 0   famotidine  (PEPCID ) 40 MG tablet, Take 40 mg by mouth daily., Disp: , Rfl:    fluticasone  (FLONASE ) 50 MCG/ACT nasal spray, Place 1 spray into both nostrils daily., Disp: 16 g, Rfl: 0   ketoconazole (NIZORAL) 2 % shampoo, Apply 1 Application topically 2 (two) times a week., Disp: , Rfl:    magic mouthwash (nystatin , lidocaine , diphenhydrAMINE , alum & mag hydroxide) suspension, Swish and swallow 5 mLs 3 (three) times daily as needed for mouth pain., Disp: 180 mL, Rfl: 0   mupirocin  ointment (BACTROBAN ) 2 %, Apply 1 application topically 2 (two) times daily. To skin sores, Disp: 30 g, Rfl: 0   mycophenolate (CELLCEPT) 500 MG tablet, Take 1 tablet by mouth daily. 2 in morning and 1 afternoon., Disp: , Rfl:    mycophenolate (CELLCEPT) 500 MG tablet, Take 1,500 mg by mouth 2 (two) times daily., Disp: , Rfl:    oxyCODONE-acetaminophen  (PERCOCET/ROXICET) 5-325 MG tablet, Take 1 tablet by mouth as needed., Disp: , Rfl:    phenol (CHLORASEPTIC) 1.4 % LIQD, Use as directed 1 spray in the mouth or throat as needed for throat irritation / pain., Disp: 118 mL, Rfl: 0   predniSONE  (DELTASONE ) 20 MG tablet, 2 tabs po daily x 4 days, Disp: 8 tablet, Rfl: 0   silver sulfADIAZINE (SILVADENE) 1 % cream, Apply 1 Application topically daily., Disp: , Rfl:  triamcinolone  cream (KENALOG ) 0.1 %, Apply 1 Application topically 2 (two) times daily., Disp: , Rfl:    No Known Allergies  Past Medical History:  Diagnosis Date   Pemphigus    PNA (pneumonia)      History reviewed. No pertinent surgical history.  Family History  Problem Relation Age of Onset   Healthy Mother     Social History   Tobacco Use   Smoking status: Former    Types: Cigarettes   Smokeless tobacco: Former    Quit date: 10/27/2010  Vaping Use   Vaping status: Never Used  Substance Use Topics   Alcohol use: No   Drug use: No     ROS   Objective:   Vitals: BP (!) 118/54 (BP Location: Left Arm)   Pulse 93   Temp 98.4 F (36.9 C) (Oral)   Resp 18   SpO2 95%   Physical Exam Constitutional:      General: He is not in acute distress.    Appearance: Normal appearance. He is well-developed and normal weight. He is not ill-appearing, toxic-appearing or diaphoretic.  HENT:     Head: Normocephalic and atraumatic.     Right Ear: Tympanic membrane, ear canal and external ear normal. No drainage, swelling or tenderness. No middle ear effusion. There is no impacted cerumen. Tympanic membrane is not erythematous or bulging.     Left Ear: Tympanic membrane, ear canal and external ear normal. No drainage, swelling or tenderness.  No middle ear effusion. There is no impacted cerumen. Tympanic membrane is not erythematous or bulging.     Nose: Congestion and rhinorrhea present.     Mouth/Throat:     Mouth: Mucous membranes are moist.     Pharynx: Posterior oropharyngeal erythema (with associated postnasal drainage overlying pharynx) present. No pharyngeal swelling, oropharyngeal exudate or uvula swelling.     Tonsils: No tonsillar exudate or tonsillar abscesses. 0 on the right. 0 on the left.  Eyes:     General: No scleral icterus.       Right eye: No discharge.        Left eye: No discharge.     Extraocular Movements: Extraocular movements intact.     Conjunctiva/sclera: Conjunctivae normal.  Cardiovascular:     Rate and Rhythm: Normal rate and regular rhythm.     Heart sounds: Normal heart sounds. No murmur heard.    No friction rub. No gallop.  Pulmonary:     Effort: Pulmonary effort is normal. No respiratory distress.     Breath sounds: Normal breath sounds. No stridor. No wheezing, rhonchi or rales.  Musculoskeletal:     Cervical back: Normal range of motion and neck supple. No rigidity. No muscular tenderness.  Neurological:     General: No focal deficit present.     Mental Status: He is alert and oriented  to person, place, and time.  Psychiatric:        Mood and Affect: Mood normal.        Behavior: Behavior normal.        Thought Content: Thought content normal.        Judgment: Judgment normal.     Assessment and Plan :   PDMP not reviewed this encounter.  1. Viral respiratory infection   2. Immunosuppression (HCC)    Patient has significant upper respiratory symptoms and therefore recommended an oral prednisone  course.  Recommend supportive care otherwise for an acute viral respiratory infection.  X-ray over-read was pending at time of  discharge, recommended follow up with only abnormal results. Otherwise will not call for negative over-read. Patient was in agreement.  Counseled patient on potential for adverse effects with medications prescribed/recommended today, ER and return-to-clinic precautions discussed, patient verbalized understanding.    Christopher Savannah, PA-C 09/05/23 1000

## 2024-01-16 ENCOUNTER — Ambulatory Visit
Admission: EM | Admit: 2024-01-16 | Discharge: 2024-01-16 | Disposition: A | Discharge status: Other Acute Inpatient Hospital

## 2024-01-16 ENCOUNTER — Ambulatory Visit (INDEPENDENT_AMBULATORY_CARE_PROVIDER_SITE_OTHER)

## 2024-01-16 ENCOUNTER — Encounter: Payer: Self-pay | Admitting: Emergency Medicine

## 2024-01-16 DIAGNOSIS — R059 Cough, unspecified: Secondary | ICD-10-CM

## 2024-01-16 DIAGNOSIS — J209 Acute bronchitis, unspecified: Secondary | ICD-10-CM

## 2024-01-16 MED ORDER — BENZONATATE 100 MG PO CAPS: 100.0000 mg | ORAL_CAPSULE | Freq: Three times a day (TID) | ORAL | 0 refills | Status: AC

## 2024-01-16 MED ORDER — GUAIFENESIN ER 600 MG PO TB12: 600.0000 mg | ORAL_TABLET | Freq: Two times a day (BID) | ORAL | 0 refills | Status: AC

## 2024-01-16 MED ORDER — PREDNISONE 50 MG PO TABS: ORAL_TABLET | ORAL | 0 refills | Status: AC

## 2024-01-16 NOTE — Discharge Instructions (Signed)
 Preliminary overread of chest xray shows no signs of pneumonia. If radiologist says something different we will call.

## 2024-01-16 NOTE — ED Triage Notes (Addendum)
 Pt c/o productive cough x's 1 week  St's his chest hurts when he coughs  Has been using OTC meds without  relief  Pt st's he feels like he did when he had pneumonia in the past

## 2024-01-16 NOTE — ED Provider Notes (Signed)
 " [facility]    CSN: [id number] Arrival date & time: 01/16/24  1016      History   Chief Complaint Chief Complaint  Patient presents with   Cough    HPI Kyle Guzman is a 53 y.o. male.   Pt presents today due to persistent cough productive of dark yellow sputum for the past week. Pt states that he has been experiencing body aches, subjective fever, and chills. Pt states that he has been using Mucinex  for symptoms without relief. Pt states that symptoms are worse when lying supine. Pt states that he also hears wheezing when he is lying flat. Pt states that he feels similar to how he did when he has pneumonia.   The history is provided by the patient.  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 No pertinent surgical history.     Home Medications    Prior to Admission medications  Medication Sig Start Date End Date Taking? Authorizing Provider  acetaminophen  (TYLENOL ) 500 MG tablet Take 1,000 mg by mouth every 6 (six) hours as needed for mild pain, moderate pain, fever or headache. 06/02/20   [provider]  albuterol (VENTOLIN HFA) 108 (90 Base) MCG/ACT inhaler Inhale 2 puffs into the lungs every 4 (four) hours as needed for wheezing or shortness of breath. 06/27/22   [provider]  Calcium Carb-Cholecalciferol (CALCIUM 600/VITAMIN D PO) Take by mouth. Take 2 tablets daily    [provider]  cetirizine  (ZYRTEC  ALLERGY) 10 MG tablet Take 1 tablet (10 mg total) by mouth daily. 09/05/23   Christopher Savannah, PA-C  chlorpheniramine-HYDROcodone  (TUSSIONEX) 10-8 MG/5ML Take 5 mLs by mouth every 12 (twelve) hours as needed for cough. Patient not taking: Reported on 01/16/2024 05/31/22   Blaise Aleene KIDD, MD  Cholecalciferol 125 MCG (5000 UT) TABS Take by mouth.    [provider]  Cholecalciferol 125 MCG (5000 UT) TABS Take by mouth. 06/02/20   [provider]  clindamycin (CLEOCIN T) 1 % lotion Apply 1 Application topically 2 (two) times daily. 02/11/22   [provider]  clobetasol (TEMOVATE) 0.05 % external solution Apply 1 Application topically 2 (two) times daily. 02/11/22   [provider]  Cyanocobalamin (B-12) 500 MCG TABS Take by mouth.    [provider]  doxycycline  (VIBRAMYCIN ) 100 MG capsule Take 1 capsule (100 mg total) by mouth 2 (two) times daily. Patient not taking: Reported on 01/16/2024 01/21/23   Sofia, Leslie K, PA-C  EPINEPHrine  0.3 mg/0.3 mL IJ SOAJ injection Inject 0.3 mg into the muscle as needed for anaphylaxis. 07/07/22   Emil Share, DO  famotidine  (PEPCID ) 40 MG tablet Take 40 mg by mouth daily. Patient not taking: Reported on 01/16/2024 11/15/20   [provider]  fluticasone  (FLONASE ) 50 MCG/ACT nasal spray Place 1 spray into both nostrils daily. 05/19/22   Hazen Darryle BRAVO, FNP  ketoconazole (NIZORAL) 2 % shampoo Apply 1 Application topically 2 (two) times a week. 02/11/22   [provider]  magic mouthwash (nystatin , lidocaine , diphenhydrAMINE , alum & mag hydroxide)  suspension Swish and swallow 5 mLs 3 (three) times daily as needed for mouth pain. 04/07/20   Wieters, Hallie C, PA-C  mupirocin  ointment (BACTROBAN ) 2 % Apply 1 application topically 2 (two) times daily. To skin sores 04/07/20   Wieters, Hallie C, PA-C  mycophenolate (CELLCEPT) 500 MG tablet Take 1 tablet by mouth daily. 2 in morning and 1 afternoon. 01/02/21   [provider]  mycophenolate (CELLCEPT) 500 MG tablet Take 1,500 mg by mouth 2 (two) times daily. 03/15/22   [provider]  oxyCODONE-acetaminophen  (PERCOCET/ROXICET) 5-325 MG tablet Take 1 tablet by mouth as needed. Patient not taking: Reported on 01/16/2024 05/06/20   [provider]  phenol (CHLORASEPTIC) 1.4 % LIQD Use as directed 1 spray in the mouth or throat as needed for throat irritation / pain. 12/24/21   Hazen Darryle BRAVO, FNP  predniSONE  (DELTASONE ) 20 MG tablet Take 2 tablets daily with breakfast. 09/05/23   Christopher Savannah, PA-C  promethazine -dextromethorphan (PROMETHAZINE -DM) 6.25-15 MG/5ML syrup Take 5 mLs by mouth 3 (three) times daily as needed for cough. 09/05/23   Christopher Savannah, PA-C  silver sulfADIAZINE (SILVADENE) 1 % cream Apply 1 Application topically daily. 06/10/20   [provider]  triamcinolone  cream (KENALOG ) 0.1 % Apply 1 Application topically 2 (two) times daily. 10/29/21   [provider]    Family History Family History  Problem Relation Age of Onset   Healthy Mother     Social History Social History[1]   Allergies   Patient has no known allergies.   Review of Systems Review of Systems  Respiratory:  Positive for cough.      Physical Exam Triage Vital Signs ED Triage Vitals  Encounter Vitals Group     BP 01/16/24 1149 127/82     Girls Systolic BP Percentile --      Girls Diastolic BP Percentile --      Boys Systolic BP Percentile --      Boys Diastolic BP Percentile --      Pulse Rate 01/16/24 1149 75     Resp 01/16/24 1149 16     Temp 01/16/24 1149 99.2 F (37.3 C)     Temp Source 01/16/24 1149 Oral     SpO2 01/16/24 1149 95 %     Weight 01/16/24 1151 140 lb (63.5 kg)     Height 01/16/24 1151 5' 6 (1.676 m)     Head Circumference --      Peak Flow --      Pain Score 01/16/24 1151 0     Pain Loc --      Pain Education --      Exclude from Growth Chart --    No data found.  Updated Vital Signs BP 127/82 (BP Location: Left Arm)   Pulse 75   Temp 99.2 F (37.3 C) (Oral)   Resp 16   Ht 5' 6 (1.676 m)   Wt 140 lb  (63.5 kg)   SpO2 95%   BMI 22.60 kg/m   Visual Acuity Right Eye Distance:   Left Eye Distance:   Bilateral Distance:    Right Eye Near:   Left Eye Near:    Bilateral Near:     Physical Exam Vitals and nursing note reviewed.  Constitutional:      General: He is not in acute distress.    Appearance: Normal appearance. He is not ill-appearing, toxic-appearing or diaphoretic.  Eyes:     General: No scleral icterus. Cardiovascular:  Rate and Rhythm: Normal rate and regular rhythm.     Heart sounds: Normal heart sounds.  Pulmonary:     Effort: Pulmonary effort is normal. No respiratory distress.     Breath sounds: Normal breath sounds. No wheezing or rhonchi.  Skin:    General: Skin is warm.  Neurological:     Mental Status: He is alert and oriented to person, place, and time.  Psychiatric:        Mood and Affect: Mood normal.        Behavior: Behavior normal.      UC Treatments / Results  Labs (all labs ordered are listed, but only abnormal results are displayed) Labs Reviewed - No data to display  EKG   Radiology No results found.  Procedures Procedures (including critical care time)  Medications Ordered in UC Medications - No data to display  Initial Impression / Assessment and Plan / UC Course  I have reviewed the triage vital signs and the nursing notes.  Pertinent labs & imaging results that were available during my care of the patient were reviewed by me and considered in my medical decision making (see chart for details).      Final Clinical Impressions(s) / UC Diagnoses   Final diagnoses:  Cough, unspecified type   Discharge Instructions   None    ED Prescriptions   None    PDMP not reviewed this encounter.    [1]  Social History Tobacco Use   Smoking status: Former    Types: Cigarettes   Smokeless tobacco: Former    Quit date: 10/27/2010  Vaping Use   Vaping status: Never Used  Substance Use Topics   Alcohol use: No    Drug use: No     Andra Corean BROCKS, PA-C 01/16/24 1207  "

## 2024-02-08 ENCOUNTER — Encounter: Payer: Self-pay | Admitting: Family Medicine

## 2024-02-24 ENCOUNTER — Encounter: Payer: Self-pay | Admitting: Physician Assistant

## 2024-03-21 ENCOUNTER — Ambulatory Visit: Admitting: Physician Assistant
